# Patient Record
Sex: Female | Born: 1955
Health system: Southern US, Community
[De-identification: ages and names within clinical notes are randomized; demographics above are authoritative.]

## PROBLEM LIST (undated history)

## (undated) DIAGNOSIS — E119 Type 2 diabetes mellitus without complications: Secondary | ICD-10-CM

## (undated) DIAGNOSIS — E871 Hypo-osmolality and hyponatremia: Secondary | ICD-10-CM

## (undated) DIAGNOSIS — N809 Endometriosis, unspecified: Secondary | ICD-10-CM

## (undated) DIAGNOSIS — N151 Renal and perinephric abscess: Secondary | ICD-10-CM

## (undated) DIAGNOSIS — F419 Anxiety disorder, unspecified: Secondary | ICD-10-CM

## (undated) DIAGNOSIS — N200 Calculus of kidney: Secondary | ICD-10-CM

## (undated) DIAGNOSIS — N118 Other chronic tubulo-interstitial nephritis: Secondary | ICD-10-CM

## (undated) DIAGNOSIS — S0291XA Unspecified fracture of skull, initial encounter for closed fracture: Secondary | ICD-10-CM

## (undated) DIAGNOSIS — D649 Anemia, unspecified: Secondary | ICD-10-CM

## (undated) DIAGNOSIS — N189 Chronic kidney disease, unspecified: Secondary | ICD-10-CM

## (undated) HISTORY — PX: TUBAL LIGATION: SHX77

## (undated) HISTORY — PX: OTHER SURGICAL HISTORY: SHX169

---

## 2014-08-21 ENCOUNTER — Emergency Department (HOSPITAL_COMMUNITY): Payer: BLUE CROSS/BLUE SHIELD

## 2014-08-21 ENCOUNTER — Encounter (HOSPITAL_COMMUNITY): Payer: Self-pay | Admitting: Emergency Medicine

## 2014-08-21 ENCOUNTER — Inpatient Hospital Stay (HOSPITAL_COMMUNITY)
Admission: EM | Admit: 2014-08-21 | Discharge: 2014-08-23 | DRG: 690 | Disposition: A | Discharge status: Home Health | Payer: BLUE CROSS/BLUE SHIELD | Attending: Internal Medicine | Admitting: Internal Medicine

## 2014-08-21 DIAGNOSIS — E1165 Type 2 diabetes mellitus with hyperglycemia: Secondary | ICD-10-CM | POA: Diagnosis present

## 2014-08-21 DIAGNOSIS — R631 Polydipsia: Secondary | ICD-10-CM | POA: Diagnosis present

## 2014-08-21 DIAGNOSIS — E755 Other lipid storage disorders: Secondary | ICD-10-CM | POA: Diagnosis present

## 2014-08-21 DIAGNOSIS — N151 Renal and perinephric abscess: Secondary | ICD-10-CM | POA: Diagnosis present

## 2014-08-21 DIAGNOSIS — E1129 Type 2 diabetes mellitus with other diabetic kidney complication: Secondary | ICD-10-CM | POA: Diagnosis not present

## 2014-08-21 DIAGNOSIS — N12 Tubulo-interstitial nephritis, not specified as acute or chronic: Principal | ICD-10-CM | POA: Diagnosis present

## 2014-08-21 DIAGNOSIS — R188 Other ascites: Secondary | ICD-10-CM

## 2014-08-21 DIAGNOSIS — N23 Unspecified renal colic: Secondary | ICD-10-CM | POA: Diagnosis not present

## 2014-08-21 DIAGNOSIS — R739 Hyperglycemia, unspecified: Secondary | ICD-10-CM

## 2014-08-21 DIAGNOSIS — E119 Type 2 diabetes mellitus without complications: Secondary | ICD-10-CM

## 2014-08-21 DIAGNOSIS — E871 Hypo-osmolality and hyponatremia: Secondary | ICD-10-CM | POA: Diagnosis present

## 2014-08-21 DIAGNOSIS — N118 Other chronic tubulo-interstitial nephritis: Secondary | ICD-10-CM

## 2014-08-21 DIAGNOSIS — Z6823 Body mass index (BMI) 23.0-23.9, adult: Secondary | ICD-10-CM | POA: Diagnosis not present

## 2014-08-21 DIAGNOSIS — N200 Calculus of kidney: Secondary | ICD-10-CM | POA: Diagnosis present

## 2014-08-21 LAB — COMPREHENSIVE METABOLIC PANEL
ALT: 10 U/L — AB (ref 14–54)
AST: 15 U/L (ref 15–41)
Albumin: 2.8 g/dL — ABNORMAL LOW (ref 3.5–5.0)
Alkaline Phosphatase: 104 U/L (ref 38–126)
Anion gap: 11 (ref 5–15)
BUN: 11 mg/dL (ref 6–20)
CO2: 25 mmol/L (ref 22–32)
Calcium: 8.6 mg/dL — ABNORMAL LOW (ref 8.9–10.3)
Chloride: 92 mmol/L — ABNORMAL LOW (ref 101–111)
Creatinine, Ser: 0.72 mg/dL (ref 0.44–1.00)
GFR calc Af Amer: >60 mL/min (ref >60)
Glucose, Bld: 485 mg/dL — ABNORMAL HIGH (ref 65–99)
Potassium: 4.1 mmol/L (ref 3.5–5.1)
Sodium: 128 mmol/L — ABNORMAL LOW (ref 135–145)
Total Bilirubin: 0.4 mg/dL (ref 0.3–1.2)
Total Protein: 7.4 g/dL (ref 6.5–8.1)

## 2014-08-21 LAB — CBC WITH DIFFERENTIAL/PLATELET
Basophils Absolute: 0.1 10*3/uL (ref 0.0–0.1)
Basophils Relative: 1 % (ref 0–1)
Eosinophils Absolute: 0.1 10*3/uL (ref 0.0–0.7)
Eosinophils Relative: 1 % (ref 0–5)
HEMATOCRIT: 32.7 % — AB (ref 36.0–46.0)
Hemoglobin: 10.2 g/dL — ABNORMAL LOW (ref 12.0–15.0)
Lymphocytes Relative: 15 % (ref 12–46)
Lymphs Abs: 1.2 10*3/uL (ref 0.7–4.0)
MCH: 24.4 pg — ABNORMAL LOW (ref 26.0–34.0)
MCHC: 31.2 g/dL (ref 30.0–36.0)
MCV: 78.2 fL (ref 78.0–100.0)
Monocytes Absolute: 0.6 10*3/uL (ref 0.1–1.0)
Monocytes Relative: 8 % (ref 3–12)
NEUTROS ABS: 6 10*3/uL (ref 1.7–7.7)
Neutrophils Relative %: 75 % (ref 43–77)
Platelets: 486 10*3/uL — ABNORMAL HIGH (ref 150–400)
RBC: 4.18 MIL/uL (ref 3.87–5.11)
RDW: 14.8 % (ref 11.5–15.5)
WBC: 8 10*3/uL (ref 4.0–10.5)

## 2014-08-21 LAB — URINE MICROSCOPIC-ADD ON

## 2014-08-21 LAB — URINALYSIS, ROUTINE W REFLEX MICROSCOPIC
Bilirubin Urine: NEGATIVE
Ketones, ur: NEGATIVE mg/dL
Nitrite: NEGATIVE
PH: 6.5 (ref 5.0–8.0)
Protein, ur: NEGATIVE mg/dL
SPECIFIC GRAVITY, URINE: 1.022 (ref 1.005–1.030)
Urobilinogen, UA: 0.2 mg/dL (ref 0.0–1.0)

## 2014-08-21 LAB — I-STAT CG4 LACTIC ACID, ED: Lactic Acid, Venous: 2.72 mmol/L (ref 0.5–2.0)

## 2014-08-21 LAB — TSH: TSH: 1.294 u[IU]/mL (ref 0.350–4.500)

## 2014-08-21 LAB — CBG MONITORING, ED
GLUCOSE-CAPILLARY: 282 mg/dL — AB (ref 65–99)
Glucose-Capillary: 325 mg/dL — ABNORMAL HIGH (ref 65–99)

## 2014-08-21 MED ORDER — ONDANSETRON HCL 4 MG PO TABS: 4.0000 mg | ORAL_TABLET | Freq: Four times a day (QID) | ORAL | Status: DC | PRN

## 2014-08-21 MED ORDER — FOLIC ACID 1 MG PO TABS
1.0000 mg | ORAL_TABLET | Freq: Every day | ORAL | Status: DC
  Administered 2014-08-21 – 2014-08-23 (×2): 1 mg via ORAL
  Filled 2014-08-21 (×2): qty 1

## 2014-08-21 MED ORDER — SODIUM CHLORIDE 0.9 % IV BOLUS (SEPSIS)
1000.0000 mL | Freq: Once | INTRAVENOUS | Status: AC
  Administered 2014-08-21: 1000 mL via INTRAVENOUS

## 2014-08-21 MED ORDER — DEXTROSE 5 % IV SOLN
1.0000 g | INTRAVENOUS | Status: DC
  Administered 2014-08-22: 1 g via INTRAVENOUS
  Filled 2014-08-21 (×2): qty 10

## 2014-08-21 MED ORDER — ACETAMINOPHEN 650 MG RE SUPP: 650.0000 mg | Freq: Four times a day (QID) | RECTAL | Status: DC | PRN

## 2014-08-21 MED ORDER — VANCOMYCIN HCL IN DEXTROSE 1-5 GM/200ML-% IV SOLN
1000.0000 mg | Freq: Once | INTRAVENOUS | Status: AC
  Administered 2014-08-21: 1000 mg via INTRAVENOUS
  Filled 2014-08-21: qty 200

## 2014-08-21 MED ORDER — VITAMIN B-1 100 MG PO TABS
100.0000 mg | ORAL_TABLET | Freq: Every day | ORAL | Status: DC
  Administered 2014-08-21 – 2014-08-23 (×2): 100 mg via ORAL
  Filled 2014-08-21 (×2): qty 1

## 2014-08-21 MED ORDER — ACETAMINOPHEN 325 MG PO TABS: 650.0000 mg | ORAL_TABLET | Freq: Four times a day (QID) | ORAL | Status: DC | PRN

## 2014-08-21 MED ORDER — ONDANSETRON HCL 4 MG/2ML IJ SOLN: 4.0000 mg | Freq: Four times a day (QID) | INTRAMUSCULAR | Status: DC | PRN

## 2014-08-21 MED ORDER — SODIUM CHLORIDE 0.9 % IV SOLN
INTRAVENOUS | Status: DC
  Administered 2014-08-21 – 2014-08-23 (×2): via INTRAVENOUS

## 2014-08-21 MED ORDER — ADULT MULTIVITAMIN W/MINERALS CH
1.0000 | ORAL_TABLET | Freq: Every day | ORAL | Status: DC
  Administered 2014-08-21 – 2014-08-23 (×2): 1 via ORAL
  Filled 2014-08-21 (×2): qty 1

## 2014-08-21 MED ORDER — CLINDAMYCIN PHOSPHATE 600 MG/50ML IV SOLN
600.0000 mg | Freq: Four times a day (QID) | INTRAVENOUS | Status: DC
Start: 2014-08-21 — End: 2014-08-22
  Administered 2014-08-21 – 2014-08-22 (×2): 600 mg via INTRAVENOUS
  Filled 2014-08-21 (×4): qty 50

## 2014-08-21 MED ORDER — CEFTRIAXONE SODIUM 1 G IJ SOLR
1.0000 g | Freq: Once | INTRAMUSCULAR | Status: AC
  Administered 2014-08-21: 1 g via INTRAVENOUS
  Filled 2014-08-21: qty 10

## 2014-08-21 MED ORDER — INSULIN ASPART 100 UNIT/ML ~~LOC~~ SOLN
0.0000 [IU] | SUBCUTANEOUS | Status: DC
  Administered 2014-08-21: 15 [IU] via SUBCUTANEOUS
  Administered 2014-08-22 (×2): 5 [IU] via SUBCUTANEOUS
  Administered 2014-08-22: 8 [IU] via SUBCUTANEOUS
  Administered 2014-08-23: 3 [IU] via SUBCUTANEOUS
  Administered 2014-08-23: 5 [IU] via SUBCUTANEOUS
  Administered 2014-08-23: 2 [IU] via SUBCUTANEOUS
  Filled 2014-08-21 (×2): qty 1

## 2014-08-21 NOTE — ED Provider Notes (Signed)
CSN: [id number]     Arrival date & time 08/21/14  1655 History   First MD Initiated Contact with Patient 08/21/14 1717     Chief Complaint  Patient presents with  . Renal Infection      (Consider location/radiation/quality/duration/timing/severity/associated sxs/prior Treatment) The history is provided by the patient.   patient was sent in for reported abscess from her kidney above the stone coming out to the skin. Had a CT scan done as an outpatient. Also told that she was diabetic with a sugar 400. States she's been urinating freely. She's been feeling bad somewhat over the last 6 months. States she lost around 65 pounds but had been trying initially but states began come off without her trying. She has a swelling in her left flank that she noticed a month ago. No fevers. No chills. Has otherwise healthy. No nausea or vomiting.  History reviewed. No pertinent past medical history. History reviewed. No pertinent past surgical history. History reviewed. No pertinent family history. History  Substance Use Topics  . Smoking status: Never Smoker   . Smokeless tobacco: Not on file  . Alcohol Use: Yes     Comment: occasionally   OB History    No data available     Review of Systems  Constitutional: Positive for appetite change and unexpected weight change. Negative for activity change.  Eyes: Negative for pain.  Respiratory: Negative for chest tightness and shortness of breath.   Cardiovascular: Negative for chest pain and leg swelling.  Gastrointestinal: Negative for nausea, vomiting, abdominal pain and diarrhea.  Genitourinary: Positive for frequency and flank pain.  Musculoskeletal: Negative for back pain and neck stiffness.  Skin: Positive for color change and wound. Negative for rash.  Neurological: Negative for weakness, numbness and headaches.  Psychiatric/Behavioral: Negative for behavioral problems.      Allergies  Review of patient's allergies indicates no known  allergies.  Home Medications   Prior to Admission medications   Medication Sig Start Date End Date Taking? Authorizing Provider  Multiple Vitamin (MULTIVITAMIN) tablet Take 1 tablet by mouth daily.   Yes Historical Provider, MD   BP 98/39 mmHg  Pulse 88  Temp(Src) 99.9 F (37.7 C) (Oral)  Resp 16  SpO2 97% Physical Exam  Constitutional: She appears well-developed and well-nourished.  Cardiovascular: Normal rate.   Pulmonary/Chest: Effort normal.  Abdominal: She exhibits distension.  Some mild left-sided tenderness with some distention.  Musculoskeletal: Normal range of motion.  Neurological: She is alert.  Skin: Skin is warm.  There is a swelling was erythema and some fluctuance at left posterior flank area. Approximately 10 cm across.      ED Course  Procedures (including critical care time) Labs Review Labs Reviewed  CBC WITH DIFFERENTIAL/PLATELET - Abnormal; Notable for the following:    Hemoglobin 10.2 (*)    HCT 32.7 (*)    MCH 24.4 (*)    Platelets 486 (*)    All other components within normal limits  COMPREHENSIVE METABOLIC PANEL - Abnormal; Notable for the following:    Sodium 128 (*)    Chloride 92 (*)    Glucose, Bld 485 (*)    Calcium 8.6 (*)    Albumin 2.8 (*)    ALT 10 (*)    All other components within normal limits  URINALYSIS, ROUTINE W REFLEX MICROSCOPIC (NOT AT [facility]) - Abnormal; Notable for the following:    APPearance CLOUDY (*)    Glucose, UA >1000 (*)    Hgb urine dipstick  LARGE (*)    Leukocytes, UA LARGE (*)    All other components within normal limits  URINE MICROSCOPIC-ADD ON - Abnormal; Notable for the following:    Bacteria, UA FEW (*)    All other components within normal limits  I-STAT CG4 LACTIC ACID, ED - Abnormal; Notable for the following:    Lactic Acid, Venous 2.72 (*)    All other components within normal limits  CBG MONITORING, ED - Abnormal; Notable for the following:    Glucose-Capillary 325 (*)    All other components  within normal limits  CBG MONITORING, ED - Abnormal; Notable for the following:    Glucose-Capillary 282 (*)    All other components within normal limits  URINE CULTURE  CULTURE, BLOOD (ROUTINE X 2)  CULTURE, BLOOD (ROUTINE X 2)  TSH  HEMOGLOBIN A1C  COMPREHENSIVE METABOLIC PANEL  CBC    Imaging Review Dg Chest 2 View  08/21/2014   CLINICAL DATA:  59 year old female with left flank bulge/abscess  EXAM: CHEST  2 VIEW  COMPARISON:  None.  FINDINGS: The lungs are clear and negative for focal airspace consolidation, pulmonary edema or suspicious pulmonary nodule. Mild central bronchitic change. Slight elevation of the left hemidiaphragm. No pleural effusion or pneumothorax. Cardiac and mediastinal contours are within normal limits. No acute fracture or lytic or blastic osseous lesions. The visualized upper abdominal bowel gas pattern is unremarkable.  IMPRESSION: Nonspecific elevation of the left hemidiaphragm with trace left basilar subsegmental atelectasis.  Otherwise, no acute cardiopulmonary process.   Electronically Signed   By: Malachy MoanHeath  McCullough M.D.   On: 08/21/2014 19:52     EKG Interpretation None      MDM   Final diagnoses:  Xanthogranulomatous pyelonephritis  Hyperglycemia    Patient presents with hyperglycemia. Also renal infection that goes through to the skin. Has been seen by nephrology likely has been going on for 6 months. Likely will need IR drain versus surgery.    Benjiman CoreNathan Cerinity Zynda, MD 08/22/14 [phone]

## 2014-08-21 NOTE — ED Notes (Signed)
Pt c/o large bulge to left flank, bulge is reddened at apex. Pt states her MD told her that she had a large renal calculi which obstructed her kidney, caused infection, and that bulge is where infection protrudes through tissue. Pt denies n/v. Pt states she had hematuria x 1 day last week, some dysuria

## 2014-08-21 NOTE — H&P (Signed)
[facility] History and Physical  Catherine Ali ZOX:[id number] DOB: 8/16/Catherine Downs1957 DOA: 08/21/2014  Referring physician: Benjiman CoreNathan Pickering, MD PCP: No primary care provider on file.   Chief Complaint: Kidney Pain  HPI: Catherine Ali is a 59 y.o. female with no significant medical history presents with a kidney infection. She states that she noted a swelling in her flank area about a month ago. Patient states that she went to the urgent care last week. Initially she was told it is a hernia. They did order a CT scan which was done in [facility]. The scan apparently is suggestive of a renal abscess. Orlene Plumna ddition she has a staghorn calculus. Patient had some lab work done and is also noted to be a diabetic. Here in the ED she has no pain noted she does not look toxic at this time. The ED did call urology and he is going to try to get IR to possibly drain the abscess or she may need to have a nephrectomy.   Review of Systems:  Constitutional:  +weight loss on purpose 65 pounds, no night sweats, no Fevers, +chills.  HEENT:  No headaches, ear ache, nasal congestion, post nasal drip,  Cardio-vascular:  No chest pain, Orthopnea, PND, swelling in lower extremities +dizziness  GI:  No heartburn, indigestion, abdominal pain, nausea, vomiting, diarrhea  Resp:  No shortness of breath with exertion or at rest. no coughing up of blood.No change in color of mucus Skin:  +rash on the left flank along with swelling in the flank GU:  no dysuria, change in color of urine, no urgency or frequency  Musculoskeletal:  No joint pain or swelling. No decreased range of motion  Psych:  No change in mood or affect. No depression or anxiety   History reviewed. No pertinent past medical history. History reviewed. No pertinent past surgical history. Social History:  reports that she has never smoked. She does not have any smokeless tobacco history on file. She reports that she drinks alcohol. She reports that  she does not use illicit drugs.  No Known Allergies  History reviewed. No pertinent family history.   Prior to Admission medications   Medication Sig Start Date End Date Taking? Authorizing Provider  Multiple Vitamin (MULTIVITAMIN) tablet Take 1 tablet by mouth daily.   Yes Historical Provider, MD   Physical Exam: Filed Vitals:   08/21/14 1710 08/21/14 1945  BP: 145/77 124/67  Pulse: 93 84  Temp: 98.3 F (36.8 C)   TempSrc: Oral   Resp: 18 14  SpO2: 100% 96%    Wt Readings from Last 3 Encounters:  No data found for Wt    General:  Appears calm and comfortable Eyes: PERRL, normal lids, irises & conjunctiva ENT: grossly normal hearing, lips & tongue Neck: no LAD, masses or thyromegaly Cardiovascular: RRR, no m/r/g. No LE edema. Respiratory: CTA bilaterally Abdomen: soft, ntnd Skin: +rash in left flank with swelling Musculoskeletal: grossly normal tone BUE/BLE Psychiatric: grossly normal mood and affect Neurologic: grossly non-focal.          Labs on Admission:  Basic Metabolic Panel:  Recent Labs Lab 08/21/14 1756  NA 128*  K 4.1  CL 92*  CO2 25  GLUCOSE 485*  BUN 11  CREATININE 0.72  CALCIUM 8.6*   Liver Function Tests:  Recent Labs Lab 08/21/14 1756  AST 15  ALT 10*  ALKPHOS 104  BILITOT 0.4  PROT 7.4  ALBUMIN 2.8*   No results for input(s): LIPASE, AMYLASE in the last  168 hours. No results for input(s): AMMONIA in the last 168 hours. CBC:  Recent Labs Lab 08/21/14 1756  WBC 8.0  NEUTROABS 6.0  HGB 10.2*  HCT 32.7*  MCV 78.2  PLT 486*   Cardiac Enzymes: No results for input(s): CKTOTAL, CKMB, CKMBINDEX, TROPONINI in the last 168 hours.  BNP (last 3 results) No results for input(s): BNP in the last 8760 hours.  ProBNP (last 3 results) No results for input(s): PROBNP in the last 8760 hours.  CBG: No results for input(s): GLUCAP in the last 168 hours.  Radiological Exams on Admission: Dg Chest 2 View  08/21/2014   CLINICAL  DATA:  59 year old female with left flank bulge/abscess  EXAM: CHEST  2 VIEW  COMPARISON:  None.  FINDINGS: The lungs are clear and negative for focal airspace consolidation, pulmonary edema or suspicious pulmonary nodule. Mild central bronchitic change. Slight elevation of the left hemidiaphragm. No pleural effusion or pneumothorax. Cardiac and mediastinal contours are within normal limits. No acute fracture or lytic or blastic osseous lesions. The visualized upper abdominal bowel gas pattern is unremarkable.  IMPRESSION: Nonspecific elevation of the left hemidiaphragm with trace left basilar subsegmental atelectasis.  Otherwise, no acute cardiopulmonary process.   Electronically Signed   By: Malachy Moan M.D.   On: 08/21/2014 19:52     Assessment/Plan Principal Problem:   Renal abscess, left Active Problems:   Type 2 diabetes mellitus   Hyponatremia   1. Left Renal Abscess -CT scan reviewed very impressive findings with air in the kidney and also tracking in the SQ tissue -Discussed with Dr Marlou Porch and he suggested holding off on antibiotics initially but would be Ok to give coverage for anaerobic as well as routine urological coverage  -Urology has seen the patient in the ED will await further recs. Possible IR drainage or surgery  2. Type 2 Diabetes Mellitus -this is new onset. Right now no AG noted will hydrate and place on SSI no step down beds available -carb modified diet  3. Hyponatremia -due to elevated blood glucose -will hydrate and monitor labs   Code Status: Full Code (must indicate code status--if unknown or must be presumed, indicate so) DVT Prophylaxis:SCD Family Communication: none (indicate person spoken with, if applicable, with phone number if by telephone) Disposition Plan: home (indicate anticipated LOS)  Time spent:  [facility] Pager [phone]

## 2014-08-21 NOTE — ED Notes (Signed)
Blood sugar: 282mg /dL

## 2014-08-21 NOTE — Consult Note (Signed)
I have been asked to see the patient by Dr. Carmell Austria, for evaluation and management of large left XGP extending into the subcutaneous space.  History of present illness: This is a 59 year old female who was referred to the emergency department after having a CT scan which demonstrated a xanthoma granulomatous pyelonephritis of the left kidney with a perinephric abscess extending to the subcutaneous tissue. The patient states that she has not seen a doctor for over 10 years and has been healthy up until recently. The patient states that over the last 6 months she has lost 65 pounds, this was initially intentional but then she continued to lose weight unintentionally. She noted about a month ago that she had a bulge in her left flank.  She also has more recently felt more fatigued. This was all concerning to her, and so she sought care at an urgent care facility.  At the urgent care facility, the patient left flank bolt was noted. Her blood sugar was also noted to be 465. A CT scan was ordered which was completed earlier this afternoon.  Remarkably, the patient has minimal pain. She has not had any flank pain so to speak. She has not had any gross hematuria or dysuria. She has not had any fevers or chills. She does endorse polydipsia and frequent urination. She also states that she occasionally has orthostatic hypotension. Prior to her presentation, the patient was taking no medications. The patient is passed several kidney stones on her own, the last one being 10 years prior. She has never seen a urologist. She has no significant history of recurrent urinary tract infections.  The patient's past medical history is largely unremarkable. The patient has no surgical history. The patient's family history is noncontributory. The patient is divorced and has 2 sons, one lives in [place] and the second son lives in [place]. She is a nonsmoker. She works full-time on Industrial/product designer. She  worked up until the end of last week.  Review of systems: A 12 point comprehensive review of systems was obtained and is negative unless otherwise stated in the history of present illness.  Patient Active Problem List   Diagnosis Date Noted  . Type 2 diabetes mellitus 08/21/2014  . Renal abscess, left 08/21/2014  . Hyponatremia 08/21/2014    No current facility-administered medications on file prior to encounter.   No current outpatient prescriptions on file prior to encounter.    History reviewed. No pertinent past medical history.  History reviewed. No pertinent past surgical history.  History  Substance Use Topics  . Smoking status: Never Smoker   . Smokeless tobacco: Not on file  . Alcohol Use: Yes     Comment: occasionally    History reviewed. No pertinent family history.  PE: Filed Vitals:   08/21/14 1710 08/21/14 1945  BP: 145/77 124/67  Pulse: 93 84  Temp: 98.3 F (36.8 C)   TempSrc: Oral   Resp: 18 14  SpO2: 100% 96%   Patient appears to be in no acute distress  patient is alert and oriented x3 Atraumatic normocephalic head No cervical or supraclavicular lymphadenopathy appreciated No increased work of breathing, no audible wheezes/rhonchi Regular sinus rhythm/rate Abdomen is soft, nontender, nondistended The patient's left flank is notable for a baseball-sized fluctuant protuberance with overlying ecchymosis. The mass is warm. There is no cellulitic component to it. It is nontender to palpation. Lower extremities are symmetric without appreciable edema Grossly neurologically intact    Recent Labs  08/21/14 1756  WBC 8.0  HGB 10.2*  HCT 32.7*    Recent Labs  08/21/14 1756  NA 128*  K 4.1  CL 92*  CO2 25  GLUCOSE 485*  BUN 11  CREATININE 0.72  CALCIUM 8.6*   No results for input(s): LABPT, INR in the last 72 hours. No results for input(s): LABURIN in the last 72 hours. No results found for this or any previous visit.  Imaging: The  CT scan will be uploaded into the PACS system tomorrow morning. The patient's CT scan is a contrast enhanced CT scan demonstrating a large left and throat granulomatous pyelonephritis with surrounding perinephric/perisplenic fluid with air bubbles which extends posteriorly around the 11th rib to the subcutaneous tissue. There is a large tachycardic calculus in the renal pelvis the left. The right kidney is normal appearing without any kidney stones.   Imp: The patient has a large left-sided xantho-granulomatous pyelonephritis that has fistulized into the surrounding space as well as into the subcutaneous tissue posteriorly. Given the findings on the CT scan, the patient looks remarkably healthy without any evidence otherwise of infection. She is afebrile, minimal pain with a normal white blood cell count.  Recommendations: I discussed this case with interventional radiology extensively. Our plan tentatively is to have interventional radiology place a drain in the perinephric space as well as the subcutaneous tissue. I think draining this with a percutaneous drain is the least morbid option for the patient and will allow the abscess to drain and cool off facilitating a cleaner operation in the future. Ultimately, the patient will need a radical nephrectomy. My hope is that the abscess is drained successfully with IR and that the patient can be discharged home with these drains. We would then schedule her for an elective nephrectomy in the near future.  The patient will need to remain nothing by mouth past midnight. She will be seen and evaluated tomorrow morning by interventional radiology who will then schedule her for drain placement at some point tomorrow. Given that she is afebrile with a normal white blood cell count, I don't see any role for antibiotics this point.  Thank you for involving me in this patient's care, we will continue to follow this very pleasant patient closely. Berniece SalinesHERRICK, Puja Caffey  W

## 2014-08-22 ENCOUNTER — Inpatient Hospital Stay (HOSPITAL_COMMUNITY): Payer: BLUE CROSS/BLUE SHIELD

## 2014-08-22 DIAGNOSIS — N118 Other chronic tubulo-interstitial nephritis: Secondary | ICD-10-CM | POA: Insufficient documentation

## 2014-08-22 DIAGNOSIS — N151 Renal and perinephric abscess: Secondary | ICD-10-CM

## 2014-08-22 LAB — CBC
HCT: 30.5 % — ABNORMAL LOW (ref 36.0–46.0)
HEMOGLOBIN: 9.5 g/dL — AB (ref 12.0–15.0)
MCH: 24.3 pg — AB (ref 26.0–34.0)
MCHC: 31.1 g/dL (ref 30.0–36.0)
MCV: 78 fL (ref 78.0–100.0)
PLATELETS: 480 10*3/uL — AB (ref 150–400)
RBC: 3.91 MIL/uL (ref 3.87–5.11)
RDW: 14.9 % (ref 11.5–15.5)
WBC: 7.7 10*3/uL (ref 4.0–10.5)

## 2014-08-22 LAB — GLUCOSE, CAPILLARY
GLUCOSE-CAPILLARY: 132 mg/dL — AB (ref 65–99)
GLUCOSE-CAPILLARY: 219 mg/dL — AB (ref 65–99)
Glucose-Capillary: 259 mg/dL — ABNORMAL HIGH (ref 65–99)

## 2014-08-22 LAB — COMPREHENSIVE METABOLIC PANEL
ALT: 10 U/L — ABNORMAL LOW (ref 14–54)
AST: 12 U/L — ABNORMAL LOW (ref 15–41)
Albumin: 2.5 g/dL — ABNORMAL LOW (ref 3.5–5.0)
Alkaline Phosphatase: 91 U/L (ref 38–126)
Anion gap: 8 (ref 5–15)
BILIRUBIN TOTAL: 0.4 mg/dL (ref 0.3–1.2)
BUN: 9 mg/dL (ref 6–20)
CO2: 26 mmol/L (ref 22–32)
Calcium: 8.4 mg/dL — ABNORMAL LOW (ref 8.9–10.3)
Chloride: 103 mmol/L (ref 101–111)
Creatinine, Ser: 0.6 mg/dL (ref 0.44–1.00)
GFR calc Af Amer: >60 mL/min (ref >60)
GFR calc non Af Amer: >60 mL/min (ref >60)
Glucose, Bld: 162 mg/dL — ABNORMAL HIGH (ref 65–99)
POTASSIUM: 3.7 mmol/L (ref 3.5–5.1)
SODIUM: 137 mmol/L (ref 135–145)
Total Protein: 7 g/dL (ref 6.5–8.1)

## 2014-08-22 LAB — CBG MONITORING, ED
GLUCOSE-CAPILLARY: 111 mg/dL — AB (ref 65–99)
GLUCOSE-CAPILLARY: 201 mg/dL — AB (ref 65–99)
Glucose-Capillary: 104 mg/dL — ABNORMAL HIGH (ref 65–99)
Glucose-Capillary: 95 mg/dL (ref 65–99)

## 2014-08-22 MED ORDER — FENTANYL CITRATE (PF) 100 MCG/2ML IJ SOLN
INTRAMUSCULAR | Status: AC | PRN
  Administered 2014-08-22 (×2): 25 ug via INTRAVENOUS

## 2014-08-22 MED ORDER — FENTANYL CITRATE (PF) 100 MCG/2ML IJ SOLN
INTRAMUSCULAR | Status: AC
  Filled 2014-08-22: qty 4

## 2014-08-22 MED ORDER — VANCOMYCIN HCL IN DEXTROSE 750-5 MG/150ML-% IV SOLN
750.0000 mg | Freq: Two times a day (BID) | INTRAVENOUS | Status: DC
  Administered 2014-08-22 – 2014-08-23 (×3): 750 mg via INTRAVENOUS
  Filled 2014-08-22 (×4): qty 150

## 2014-08-22 MED ORDER — MIDAZOLAM HCL 2 MG/2ML IJ SOLN
INTRAMUSCULAR | Status: AC
  Filled 2014-08-22: qty 6

## 2014-08-22 MED ORDER — MIDAZOLAM HCL 2 MG/2ML IJ SOLN
INTRAMUSCULAR | Status: AC | PRN
  Administered 2014-08-22: 0.5 mg via INTRAVENOUS
  Administered 2014-08-22: 1 mg via INTRAVENOUS
  Administered 2014-08-22: 0.5 mg via INTRAVENOUS
  Administered 2014-08-22: 1 mg via INTRAVENOUS
  Administered 2014-08-22 (×2): 0.5 mg via INTRAVENOUS

## 2014-08-22 MED ORDER — HEPARIN SODIUM (PORCINE) 5000 UNIT/ML IJ SOLN
5000.0000 [IU] | Freq: Three times a day (TID) | INTRAMUSCULAR | Status: DC
  Administered 2014-08-22 – 2014-08-23 (×3): 5000 [IU] via SUBCUTANEOUS
  Filled 2014-08-22 (×6): qty 1

## 2014-08-22 NOTE — Progress Notes (Signed)
Patient Demographics  Catherine Ali, is a 59 y.o. female, DOB - [birth year], WUX:[id number]  Admit date - 08/21/2014   Admitting Physician Yevonne PaxSaadat A Khan, MD  Outpatient Primary MD for the patient is No primary care provider on file.  LOS - 1   Chief Complaint  Patient presents with  . 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 So she presents to ED for further evaluation, seen by urology in ED, with recommendation for IR drainage,  patient had drain placed with no significant output . Subjective:   Catherine Ali today has, No headache, No chest pain, No abdominal pain - No Nausea, No new weakness tingling or numbness, No Cough - SOB.   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 - Will follow on cultures, AFB and smears . - Continue with IV Rocephin pending further culture results .  Diabetes mellitus - Posterior BUN diagnosis, follow on hemoglobin A1c,  continue to monitor on insulin sliding scale, will consider starting metformin. Discharge.   hyponatremia - Resolved,    Code Status: Full code   Family Communication: Family at bedside   Disposition Plan: Home when stable    Procedures  Left perinephric fluid collection CT-guided drainage of percutaneous catheter    Consults     Urology IR    Medications  Scheduled Meds: . cefTRIAXone (ROCEPHIN)  IV  1 g Intravenous Q24H  . clindamycin (CLEOCIN) IV  600 mg Intravenous 4 times per day  . folic acid  1 mg Oral Daily  . insulin aspart  0-15 Units Subcutaneous 6 times per day  . multivitamin with minerals  1 tablet Oral Daily  . thiamine  100 mg Oral Daily  . vancomycin  750 mg Intravenous Q12H   Continuous Infusions: . sodium chloride 75 mL/hr at 08/21/14 2154   PRN Meds:.acetaminophen **OR** acetaminophen, ondansetron **OR** ondansetron (ZOFRAN) IV  DVT Prophylaxis  heparin  Lab Results  Component Value Date   PLT 480* 08/22/2014    Antibiotics    Anti-infectives    Start     Dose/Rate Route Frequency Ordered Stop   08/22/14 2000  cefTRIAXone (ROCEPHIN) 1 g in dextrose 5 % 50 mL IVPB     1 g 100 mL/hr over 30 Minutes Intravenous Every 24 hours 08/21/14 2219     08/22/14 1000  vancomycin (VANCOCIN) IVPB 750 mg/150 ml premix     750 mg 150 mL/hr over 60 Minutes Intravenous Every 12 hours 08/22/14 0634     08/21/14 2130  vancomycin (VANCOCIN) IVPB 1000 mg/200 mL premix     1,000 mg 200 mL/hr over 60 Minutes Intravenous  Once 08/21/14 2108 08/21/14 2258   08/21/14 2045  clindamycin (  CLEOCIN) IVPB 600 mg     600 mg 100 mL/hr over 30 Minutes Intravenous 4 times per day 08/21/14 2032     08/21/14 1945  cefTRIAXone (ROCEPHIN) 1 g in dextrose 5 % 50 mL IVPB     1 g 100 mL/hr over 30 Minutes Intravenous  Once 08/21/14 1935 08/21/14 2018          Objective:   Filed Vitals:   08/22/14 1200 08/22/14 1246 08/22/14 1345 08/22/14 1445  BP: 95/48 115/64 108/59 119/58  Pulse: 73 74 77 84  Temp:  98 F (36.7 C) 98 F (36.7 C) 98.5 F (36.9 C)  TempSrc:  Oral Oral Oral  Resp:   18 18  Height:      Weight:      SpO2: 99% 100% 98% 98%    Wt Readings from Last 3 Encounters:  08/22/14 65.772 kg (145 lb)     Intake/Output Summary (Last 24 hours) at 08/22/14 1527 Last data filed at 08/22/14  1403  Gross per 24 hour  Intake    240 ml  Output      0 ml  Net    240 ml     Physical Exam  Awake Alert, Oriented X 3, No new F.N deficits, Normal affect [place].AT,PERRAL Supple Neck,No JVD, No cervical lymphadenopathy appriciated.  Symmetrical Chest wall movement, Good air movement bilaterally,  RRR,No Gallops,Rubs or new Murmurs, No Parasternal Heave +ve B.Sounds, Abd Soft, No tenderness,  No organomegaly appriciated, No rebound - guarding or rigidity.  2 drains originated from left flank with minimal . No Cyanosanguinous material sis, Clubbing or edema, No new Rash or bruise     Data Review   Micro Results No results found for this or any previous visit (from the past 240 hour(s)).  Radiology Reports Dg Chest 2 View  08/21/2014   CLINICAL DATA:  59 year old female with left flank bulge/abscess  EXAM: CHEST  2 VIEW  COMPARISON:  None.  FINDINGS: The lungs are clear and negative for focal airspace consolidation, pulmonary edema or suspicious pulmonary nodule. Mild central bronchitic change. Slight elevation of the left hemidiaphragm. No pleural effusion or pneumothorax. Cardiac and mediastinal contours are within normal limits. No acute fracture or lytic or blastic osseous lesions. The visualized upper abdominal bowel gas pattern is unremarkable.  IMPRESSION: Nonspecific elevation of the left hemidiaphragm with trace left basilar subsegmental atelectasis.  Otherwise, no acute cardiopulmonary process.   Electronically Signed   By: Malachy Moan M.D.   On: 08/21/2014 19:52     CBC  Recent Labs Lab 08/21/14 1756 08/22/14 0500  WBC 8.0 7.7  HGB 10.2* 9.5*  HCT 32.7* 30.5*  PLT 486* 480*  MCV 78.2 78.0  MCH 24.4* 24.3*  MCHC 31.2 31.1  RDW 14.8 14.9  LYMPHSABS 1.2  --   MONOABS 0.6  --   EOSABS 0.1  --   BASOSABS 0.1  --     Chemistries   Recent Labs Lab 08/21/14 1756 08/22/14 0500  NA 128* 137  K 4.1 3.7  CL 92* 103  CO2 25 26  GLUCOSE 485* 162*  BUN 11 9    CREATININE 0.72 0.60  CALCIUM 8.6* 8.4*  AST 15 12*  ALT 10* 10*  ALKPHOS 104 91  BILITOT 0.4 0.4   ------------------------------------------------------------------------------------------------------------------ estimated creatinine clearance is 71.8 mL/min (by C-G formula based on Cr of 0.6). ------------------------------------------------------------------------------------------------------------------ No results for input(s): HGBA1C in the last 72 hours. ------------------------------------------------------------------------------------------------------------------ No results for input(s): CHOL, HDL, LDLCALC,  TRIG, CHOLHDL, LDLDIRECT in the last 72 hours. ------------------------------------------------------------------------------------------------------------------  Recent Labs  08/21/14 2058  TSH 1.294   ------------------------------------------------------------------------------------------------------------------ No results for input(s): VITAMINB12, FOLATE, FERRITIN, TIBC, IRON, RETICCTPCT in the last 72 hours.  Coagulation profile No results for input(s): INR, PROTIME in the last 168 hours.  No results for input(s): DDIMER in the last 72 hours.  Cardiac Enzymes No results for input(s): CKMB, TROPONINI, MYOGLOBIN in the last 168 hours.  Invalid input(s): CK ------------------------------------------------------------------------------------------------------------------ Invalid input(s): POCBNP     Time Spent in minutes   25 minutes   Skilar Marcou M.D on 08/22/2014 at 3:27 PM  Between 7am to 7pm - Pager - [phone]  After 7pm go to [email or web address] - password [email or web address]  [facility]   Office  [phone]

## 2014-08-22 NOTE — Discharge Instructions (Signed)
Ascites Drainage Catheter Placement °Ascites is a gathering of fluid in the abdomen. Excess fluid can collect in the body from underlying conditions such as cancer or heart failure. Inserting an ascites drainage catheter is done to help drain fluid out of the body. A catheter is a thin, flexible tube. The catheter can stay attached to your body for several months. Draining the fluid will help you to avoid pain and other symptoms. In most cases, you will be able to go home the same day as the procedure (outpatient).  °LET YOUR CAREGIVER KNOW ABOUT: °· Allergies to food or medicine. °· Medicines taken, including vitamins, herbs, eyedrops, over-the-counter medicines, and creams. °· Use of steroids (by mouth or creams). °· Previous problems with anesthetics or numbing medicines. °· History of bleeding problems or blood clots. °· Previous abdominal or pelvic surgery. °· Other health problems, including diabetes and kidney problems. °· Possibility of pregnancy, if this applies. °RISKS AND COMPLICATIONS °General surgical complications may include: °· Reaction to anesthesia. °· Damage to surrounding nerves, tissues, or structures. °· A small hole (perforation) in your bowel. °· Infection. °· Bleeding. °The following complications are very rare: °· Electrolyte imbalance. °· Leakage of fluids. °· Blockage in the bowel. °· Kidney failure. °· Liver problems. °BEFORE THE PROCEDURE °It is important to follow your caregiver's instructions prior to your procedure to avoid complications. Steps before your procedure may include: °· A physical exam, blood tests, urine tests, X-rays, ultrasound exams, and other procedures. °· Chemotherapy or radiation therapy. °· A review of the procedure, the anesthesia being used, and what to expect after the procedure. °You may be asked to: °· Stop taking certain medicines for several days prior to your procedure, such as blood thinners (including aspirin). °· Take certain medicines, such as  antibiotics. °· Quit smoking. Smoking increases the chances of a healing problem after your procedure. °Arrange for someone to drive you home after surgery. You will also need someone to help you with drainage at home if you cannot do it yourself. Your caregiver can help you arrange for a home health service, if needed. °PROCEDURE °You will lie down on an X-ray table with your head elevated. Medicine will be given to numb the abdominal area (local anesthetic). You will be given medicine to help you relax (sedative). Your blood pressure and heart rate will be monitored during the procedure. °Your caregiver will clean the skin around the insertion site with antibacterial solution. A towel will be draped over the abdominal area to provide a clean work area. Ultrasound will be used to guide the placement of the catheter. An X-ray may be taken to ensure proper placement. Stitches will be placed on the skin around the catheter. The procedure takes about 15 minutes. °AFTER THE PROCEDURE °· Your caregiver may drain the fluid. °· You will need to stay and rest in the recovery area for a few hours until the sedative wears off. °· You can eat and drink as normal. °· You may need to return to your caregiver in 4 to 5 days to have your stitches removed. °Document Released: 02/19/2011 Document Revised: 05/25/2011 Document Reviewed: 02/19/2011 °ExitCare® Patient Information ©2015 [employer]. This information is not intended to replace advice given to you by your health care provider. Make sure you discuss any questions you have with your health care provider. ° °

## 2014-08-22 NOTE — ED Notes (Signed)
Introduced myself to family, pt is not back from procedure yet

## 2014-08-22 NOTE — Procedures (Signed)
Interventional Radiology Procedure Note  Procedure: 1.) Placement 22F drain into left perinephric/retroperitoneal fluid collection.  Minimal aspiration of perhaps 10 mL bloody fluid and clot.  2.) Placement of 22F drain into deep aspect of left flank soft tissue fluid and gas collection with aspiration of minimal (5 mL) bloody fluid.   Complications: None  Estimated Blood Loss: 0  Recommendations: - Follow cultures including TB - Given minimal aspiration output despite large bore drains, I suspect a high likelihood of drain failure.  Pt may need surgical debridement.   Signed,  Sterling BigHeath K. McCullough, MD

## 2014-08-22 NOTE — Progress Notes (Signed)
Urology Inpatient Progress Report left kidney pain  08/21/2014  Intv/Subj: No acute events overnight. Patient is without complaint.  History reviewed. No pertinent past medical history. Current Facility-Administered Medications  Medication Dose Route Frequency Provider Last Rate Last Dose  . 0.9 %  sodium chloride infusion   Intravenous Continuous Yevonne PaxSaadat A Khan, MD 75 mL/hr at 08/21/14 2154    . acetaminophen (TYLENOL) tablet 650 mg  650 mg Oral Q6H PRN Yevonne PaxSaadat A Khan, MD       Or  . acetaminophen (TYLENOL) suppository 650 mg  650 mg Rectal Q6H PRN Yevonne PaxSaadat A Khan, MD      . cefTRIAXone (ROCEPHIN) 1 g in dextrose 5 % 50 mL IVPB  1 g Intravenous Q24H Danford Badrew A Wofford, RPH      . clindamycin (CLEOCIN) IVPB 600 mg  600 mg Intravenous 4 times per day Yevonne PaxSaadat A Khan, MD   Stopped at 08/22/14 0535  . folic acid (FOLVITE) tablet 1 mg  1 mg Oral Daily Yevonne PaxSaadat A Khan, MD   1 mg at 08/21/14 2156  . insulin aspart (novoLOG) injection 0-15 Units  0-15 Units Subcutaneous 6 times per day Yevonne PaxSaadat A Khan, MD   5 Units at 08/22/14 0612  . multivitamin with minerals tablet 1 tablet  1 tablet Oral Daily Yevonne PaxSaadat A Khan, MD   1 tablet at 08/21/14 2156  . ondansetron (ZOFRAN) tablet 4 mg  4 mg Oral Q6H PRN Yevonne PaxSaadat A Khan, MD       Or  . ondansetron [facility](ZOFRAN) injection 4 mg  4 mg Intravenous Q6H PRN Yevonne PaxSaadat A Khan, MD      . thiamine (VITAMIN B-1) tablet 100 mg  100 mg Oral Daily Yevonne PaxSaadat A Khan, MD   100 mg at 08/21/14 2156  . vancomycin (VANCOCIN) IVPB 750 mg/150 ml premix  750 mg Intravenous Q12H Lorenza EvangelistElizabeth R Green, [facility]RPH       Current Outpatient Prescriptions  Medication Sig Dispense Refill  . Multiple Vitamin (MULTIVITAMIN) tablet Take 1 tablet by mouth daily.       Objective: Vital: Filed Vitals:   08/22/14 0000 08/22/14 0216 08/22/14 0605 08/22/14 0618  BP: 98/39 130/60 104/54   Pulse: 88 90 75   Temp:      TempSrc:      Resp: 16 18 18    Height:    5\' 6"  (1.676 m)  Weight:    65.772 kg (145 lb)  SpO2: 97%  93% 93%    I/Os:    Physical Exam:  General: Patient is in no apparent distress Lungs: Normal respiratory effort, chest expands symmetrically. GI: The abdomen is soft and nontender without mass. Ext: lower extremities symmetric  Lab Results:  Recent Labs  08/21/14 1756 08/22/14 0500  WBC 8.0 7.7  HGB 10.2* 9.5*  HCT 32.7* 30.5*    Recent Labs  08/21/14 1756 08/22/14 0500  NA 128* 137  K 4.1 3.7  CL 92* 103  CO2 25 26  GLUCOSE 485* 162*  BUN 11 9  CREATININE 0.72 0.60  CALCIUM 8.6* 8.4*   No results for input(s): LABPT, INR in the last 72 hours. No results for input(s): LABURIN in the last 72 hours. No results found for this or any previous visit.  Studies/Results: CT scan in [facility] system - being scanned into chart this AM  Assessment:  Large XGP on the left with abscess extending into the left flank/subcutaneous area.  Nontoxic appearing  Plan: Plan for IR drainage today.  Culture exudate. Home with  drains when stable. Consider Augmentin upon discharge. I will follow-up with patient next Monday. Dr. Retta Diones will be covering in my absense if any issues arise.  Please page him with urgent questions or concerns.  Berniece Salines W 08/22/2014, 7:09 AM

## 2014-08-22 NOTE — ED Notes (Addendum)
Pt notified that she will be taken upstairs to 5W

## 2014-08-22 NOTE — Consult Note (Signed)
Reason for consult:  Left xanthogranulomatous pyelonephritis with perinephric abscess, soft tissue fluid collection, need for percutaneous drainage  Referring Physician(s): Herrick/Elgergawy  History of Present Illness: Catherine Ali is a 59 y.o. 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 Current complaints are mild headache, and mild left flank soreness. She denies chest pain, dyspnea, nausea /vomiting, or unusual bleeding. She has had no prior surgeries. Patient was seen by urology and request is now been made for CT-guided drainage of the left perinephric and subcutaneous/ soft tissue abscesses/fluid collections.  History reviewed. No pertinent past medical history.  History reviewed. No pertinent past surgical history.  Allergies: Review of patient's allergies indicates no known allergies.  Medications: Prior to Admission medications   Medication Sig Start Date End Date Taking? Authorizing Provider  Multiple Vitamin (MULTIVITAMIN) tablet Take 1 tablet by mouth daily.   Yes Historical Provider, MD     History reviewed. No pertinent family history.  History   Social History  . Marital Status: Single    Spouse Name: N/A  . Number of Children: N/A  . Years of Education: N/A   Social History Main Topics  . Smoking status: Never Smoker   . Smokeless tobacco: Not on file  . Alcohol Use: Yes     Comment: occasionally  . Drug Use: No  . Sexual Activity: Not on file   Other Topics Concern  . None   Social History Narrative  . None      Review of Systems see above  Vital Signs: BP 104/54 mmHg  Pulse 75  Temp(Src) 99.9 F (37.7 C) (Oral)  Resp 18  Ht 5\' 6"  (1.676 m)  Wt 145 lb (65.772 kg)  BMI 23.41 kg/m2  SpO2  93%  Physical Exam patient awake, alert. Chest with slightly diminished breath sounds left base, right clear; heart with regular rate and rhythm. Abdomen soft, positive bowel sounds, mild tenderness left lateral region; baseball size left flank soft tissue mass with associated purpura, mildly tender to palpation; extremities with  full range of motion and no edema.  Mallampati Score:     Imaging: Dg Chest 2 View  08/21/2014   CLINICAL DATA:  59 year old female with left flank bulge/abscess  EXAM: CHEST  2 VIEW  COMPARISON:  None.  FINDINGS: The lungs are clear and negative for focal airspace consolidation, pulmonary edema or suspicious pulmonary nodule. Mild central bronchitic change. Slight elevation of the left hemidiaphragm. No pleural effusion or pneumothorax. Cardiac and mediastinal contours are within normal limits. No acute fracture or lytic or blastic osseous lesions. The visualized upper abdominal bowel gas pattern is unremarkable.  IMPRESSION: Nonspecific elevation of the left hemidiaphragm with trace left basilar subsegmental atelectasis.  Otherwise, no acute cardiopulmonary process.   Electronically Signed   By: Malachy MoanHeath  McCullough M.D.   On: 08/21/2014 19:52    Labs:  CBC:  Recent Labs  08/21/14 1756 08/22/14 0500  WBC 8.0 7.7  HGB 10.2* 9.5*  HCT 32.7* 30.5*  PLT 486* 480*    COAGS: No results for input(s): INR, APTT in the last 8760 hours.  BMP:  Recent Labs  08/21/14 1756 08/22/14 0500  NA 128* 137  K 4.1 3.7  CL 92* 103  CO2 25 26  GLUCOSE 485* 162*  BUN 11 9  CALCIUM 8.6* 8.4*  CREATININE 0.72 0.60  GFRNONAA >60 >60  GFRAA >60 >60    LIVER FUNCTION TESTS:  Recent Labs  08/21/14 1756 08/22/14 0500  BILITOT 0.4 0.4  AST 15 12*  ALT 10* 10*  ALKPHOS 104 91  PROT 7.4 7.0  ALBUMIN 2.8* 2.5*    TUMOR MARKERS: No results for input(s): AFPTM, CEA, CA199, CHROMGRNA in the last 8760 hours.  Assessment and Plan: Catherine Ali is a 59 y.o. 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 Current complaints are mild headache, and mild left flank soreness. She denies chest pain, dyspnea, nausea /vomiting, or unusual bleeding. She has had no prior surgeries. Patient was seen by urology and request has now been made for CT-guided drainage of the left perinephric and subcutaneous/ soft tissue abscesses/fluid collections. Imaging studies have been reviewed by Dr. Archer Asa and details/risks of procedure, including but not limited to, internal bleeding, infection, injury to adjacent organs, need for surgery, discussed with patient with her understanding and consent. Procedure scheduled for this morning.   Thank you for this interesting consult.  I greatly enjoyed meeting Catherine Ali and look forward to participating in their care.  Signed: D. Jeananne Rama 08/22/2014, 9:47 AM   I spent a total of 20 minutes  in face to face in clinical consultation, greater than 50% of which was counseling/coordinating care for CT-guided drainage of left perinephric/subcutaneous soft tissue abscess/fluid collections

## 2014-08-22 NOTE — Progress Notes (Signed)
ANTIBIOTIC CONSULT NOTE - INITIAL  Pharmacy Consult for Rocephin/Vancomycin Indication: Renal abscess  No Known Allergies  Patient Measurements: Height: 5\' 6"  (167.6 cm) Weight: 145 lb (65.772 kg) IBW/kg (Calculated) : 59.3   Vital Signs: Temp: 99.9 F (37.7 C) (06/07 2311) Temp Source: Oral (06/07 2311) BP: 104/54 mmHg (06/08 0605) Pulse Rate: 75 (06/08 0605) Intake/Output from previous day:   Intake/Output from this shift:    Labs:  Recent Labs  08/21/14 1756 08/22/14 0500  WBC 8.0 7.7  HGB 10.2* 9.5*  PLT 486* 480*  CREATININE 0.72 0.60   Estimated Creatinine Clearance: 71.8 mL/min (by C-G formula based on Cr of 0.6). No results for input(s): VANCOTROUGH, VANCOPEAK, VANCORANDOM, GENTTROUGH, GENTPEAK, GENTRANDOM, TOBRATROUGH, TOBRAPEAK, TOBRARND, AMIKACINPEAK, AMIKACINTROU, AMIKACIN in the last 72 hours.   Microbiology: No results found for this or any previous visit (from the past 720 hour(s)).  Medical History: History reviewed. No pertinent past medical history.  Medications:   (Not in a hospital admission) Scheduled:  . folic acid  1 mg Oral Daily  . insulin aspart  0-15 Units Subcutaneous 6 times per day  . multivitamin with minerals  1 tablet Oral Daily  . thiamine  100 mg Oral Daily  . vancomycin  750 mg Intravenous Q12H   Infusions:  . sodium chloride 75 mL/hr at 08/21/14 2154  . cefTRIAXone (ROCEPHIN)  IV    . clindamycin (CLEOCIN) IV Stopped (08/22/14 0535)   Assessment: 58 yoF c/o kidney pain.  Rocephin and Vancomycin per Rx for renal abscess.  Goal of Therapy:  Vancomycin trough level 15-20 mcg/ml  Plan:   Rocephin 1Gm IV q24h  Vancomycin 1Gm x1 ED then 750mg  IV q12h  F/u SCr/cultures/levels as needed  Susanne GreenhouseGreen, Finnley Lewis R 08/22/2014,6:35 AM

## 2014-08-22 NOTE — Progress Notes (Signed)
I have reviewed the patient's CT scan prior to admission, as well as notes from Dr. Greggory StallionMcCulloch. Although not much fluid was obtained during the initial drainage, she has had a fair amount of drainage into her 2 suction devices.  She is stable at this time. She tolerated her drain procedure well. I think it's worthwhile teaching the patient how to empty these drains. I'm fine with letting her go home in the morning if she remains stable, with follow-up with Dr. Marlou PorchHerrick on Monday of next week to plan eventual surgery. She agrees with this plan.

## 2014-08-22 NOTE — ED Notes (Signed)
Patient transported to CT 

## 2014-08-23 ENCOUNTER — Other Ambulatory Visit: Payer: Self-pay

## 2014-08-23 LAB — URINE CULTURE
COLONY COUNT: NO GROWTH
CULTURE: NO GROWTH

## 2014-08-23 LAB — GLUCOSE, CAPILLARY
GLUCOSE-CAPILLARY: 148 mg/dL — AB (ref 65–99)
Glucose-Capillary: 120 mg/dL — ABNORMAL HIGH (ref 65–99)
Glucose-Capillary: 166 mg/dL — ABNORMAL HIGH (ref 65–99)
Glucose-Capillary: 225 mg/dL — ABNORMAL HIGH (ref 65–99)

## 2014-08-23 MED ORDER — GLIPIZIDE 5 MG PO TABS: 2.5000 mg | ORAL_TABLET | Freq: Every day | ORAL | Status: DC

## 2014-08-23 MED ORDER — GLIPIZIDE 2.5 MG HALF TABLET
2.5000 mg | ORAL_TABLET | Freq: Every day | ORAL | Status: DC
  Filled 2014-08-23 (×2): qty 1

## 2014-08-23 MED ORDER — CIPROFLOXACIN HCL 500 MG PO TABS: 500.0000 mg | ORAL_TABLET | Freq: Two times a day (BID) | ORAL | Status: AC

## 2014-08-23 NOTE — Progress Notes (Signed)
Discussed discharge instructions with patient and educated patient on how to drain, recharge and flush the two JP drains. Patient understood and did teach back. Patient's vitals were stable with no complaints of pain.

## 2014-08-23 NOTE — Discharge Summary (Signed)
Catherine Ali, is a 59 y.o. female  DOB May 15, 1955  MRN [id number].  Admission date:  08/21/2014  Admitting Physician  Catherine Pax, MD  Discharge Date:  08/23/2014   Primary MD  No primary care provider on file.  Recommendations for primary care physician for things to follow:  - Patient with new diagnosis of diabetes mellitus, please follow on hemoglobin A1c, started on low dose glipizide, please continue to monitor and adjust medications accordingly. - Please follow on perinephric drain cultures and AFB results. 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 female with no significant medical history presents with a kidney infection. She states that she noted a swelling in her flank area about a month ago. Patient states that she went to the urgent care last week. Initially she was told it is a hernia. They did order a CT scan which was done in [facility]. The scan apparently is suggestive of a renal abscess. Catherine Ali she has a staghorn calculus. Patient had some lab work done and is also noted to be a diabetic. Here in the ED she has no pain noted she does  not look toxic at this time. The ED did call urology and he is going to try to get IR to possibly drain the abscess or she may need to have a nephrectomy.    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 So she presents to ED for further evaluation, seen by urology in ED, with recommendation for IR drainage, patient had drain placed with no significant output .   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 - Will need to follow on cultures, AFB and smears as outpatient  - Treated with IV vancomycin and Rocephin during hospital stay, will discharge and oral ciprofloxacin until seen by urology as an outpatient. Dr. headache this coming Tuesday, most likely will end up requiring nephrectomy.  Diabetes mellit  - Was good controlled on insulin sliding scale ,  start on low-dose glipizide on discharge   hyponatremia - Resolved,    Discharge Condition: Stable    Follow UP  Follow-up Information    Follow up with Catherine Fat, MD.   Specialty:  Urology   Why:  follow on your previously scheduled appointment    Contact information:   [address] [place] [address] [phone]         Discharge Instructions  and  Discharge Medications     Discharge Instructions    Discharge instructions    Complete by:  As directed   Follow with Primary MD No primary care provider on file. in 7 days   Get CBC, CMP, 2 view Chest X ray checked  by Primary MD next visit.    Activity: As tolerated with Full fall precautions use walker/cane &  assistance as needed   Disposition Home **   Diet: Heart Healthy , carbohydrate modified , with feeding assistance and aspiration precautions.  For Heart failure patients - Check your Weight same time everyday, if you gain over 2 pounds, or you develop in leg swelling, experience more shortness of breath or chest pain, call your Primary MD immediately. Follow Cardiac Low Salt Diet and 1.5 lit/day fluid restriction.   On your next visit with your primary care physician please Get Medicines reviewed and adjusted.   Please request your Prim.MD to go over all Hospital Tests and Procedure/Radiological results at the follow up, please get all Hospital records sent to your Prim MD by signing hospital release before you go home.   If you experience worsening of your admission symptoms, develop shortness of breath, life threatening emergency, suicidal or homicidal thoughts you must seek medical attention immediately by calling 911 or calling your MD immediately  if symptoms less severe.  You Must read complete instructions/literature along with all the possible adverse reactions/side effects for all the Medicines you take and that have been prescribed to you. Take any new Medicines after you have completely understood and accpet all the possible adverse reactions/side effects.   Do not drive, operating heavy machinery, perform activities at heights, swimming or participation in water activities or provide baby sitting services if your were admitted for syncope or siezures until you have seen by Primary MD or a Neurologist and advised to do so again.  Do not drive when taking Pain medications.    Do not take more than prescribed Pain, Sleep and Anxiety Medications  Special Instructions: If you have smoked or chewed Tobacco  in the last 2 yrs please stop smoking, stop any regular Alcohol  and or any Recreational drug use.  Wear Seat belts while driving.   Please note  You were cared for by a  hospitalist during your hospital stay. If you have any questions about your discharge medications or the care you received while you were in the hospital after you are discharged, you can call the unit and asked to speak with the hospitalist on call if the hospitalist that took care of you is not available. Once you are discharged, your primary care physician will handle any further medical issues. Please note that NO REFILLS for any discharge medications will be authorized once you are discharged, as it is imperative that you return to your primary care physician (or establish a relationship with a primary care physician if you do not have one) for your aftercare needs so that they can reassess your need for medications and monitor your lab values.  Discharge wound care:    Complete by:  As directed   once daily irrigation of drains with 5 cc's sterile NS, recording of outputs and dressing changes every 2-3 days; pt to f/u with urology next week; call [phone] or [phone] with any IR related questions. Pt instructed on how to flush drains     Increase activity slowly    Complete by:  As directed             Medication List    TAKE these medications        ciprofloxacin 500 MG tablet  Commonly known as:  CIPRO  Take 1 tablet (500 mg total) by mouth 2 (two) times daily.     glipiZIDE 5 MG tablet  Commonly known as:  GLUCOTROL  Take 0.5 tablets (2.5 mg total) by mouth daily before breakfast.     multivitamin tablet  Take 1 tablet by mouth daily.          Diet and Activity recommendation: See Discharge Instructions above   Consults obtained -    Major procedures and Radiology Reports - PLEASE review detailed and final reports for all details, in brief -      Dg Chest 2 View  08/21/2014   CLINICAL DATA:  59 year old female with left flank bulge/abscess  EXAM: CHEST  2 VIEW  COMPARISON:  None.  FINDINGS: The lungs are clear and negative for focal airspace consolidation,  pulmonary edema or suspicious pulmonary nodule. Mild central bronchitic change. Slight elevation of the left hemidiaphragm. No pleural effusion or pneumothorax. Cardiac and mediastinal contours are within normal limits. No acute fracture or lytic or blastic osseous lesions. The visualized upper abdominal bowel gas pattern is unremarkable.  IMPRESSION: Nonspecific elevation of the left hemidiaphragm with trace left basilar subsegmental atelectasis.  Otherwise, no acute cardiopulmonary process.   Electronically Signed   By: Malachy Moan M.D.   On: 08/21/2014 19:52   Ct Image Guided Drainage By Percutaneous Catheter  08/22/2014   CLINICAL DATA:  59 year old female with left-sided xanthogranulomatous pyelonephritis with rupture of xanthogranulomatous versus infectious material into the posterior perinephric space with necessitation through the left posterolateral abdominal wall. Will attempt decompression via minimally invasive percutaneous drainage prior to surgical debridement.  EXAM: CT IMAGE GUIDED DRAINAGE BY PERCUTANEOUS CATHETER; CT CORE BIOPSY RENAL  Date: 08/22/2014  PROCEDURE: 1. CT-guided placement of a 14 French drainage catheter into the left posterior perinephric fluid and gas collection. 2. CT-guided placement of a 14 French drainage catheter into the deep portion of the left flank soft tissue fluid and gas collection. Interventional Radiologist:  Catherine Big, MD  ANESTHESIA/SEDATION: Moderate (conscious) sedation was used. versed and fentanyl were administered intravenously. The patient's vital signs were monitored continuously by radiology nursing throughout the procedure.  Sedation Time: 20 minutes  MEDICATIONS: None additional  TECHNIQUE: Informed consent was obtained from the patient following explanation of the procedure, risks, benefits and alternatives. The patient understands, agrees and consents for the procedure. All questions were addressed. A time out was performed.  A planning  axial CT scan was performed. The complex multiloculated fluid and gas collection in the left perinephric space and the left posterolateral flank were both identified. A suitable skin entry sites were selected and marked.  The region was then sterilely prepped and draped in standard fashion using Betadine skin prep. Local anesthesia was attained by infiltration with 1% lidocaine.  Under intermittent CT fluoroscopic guidance, separate 18 gauge trocar needles were simultaneously advanced into both  the retroperitoneal and superficial soft tissue fluid and gas collections. Once needle tip placement was secured appropriately within the collections, attention was turned to the more anterior drainage catheter. A while was coiled within the fluid and gas collection in the tract serially dilated to 12 [place]. A Cook 12 [place] all-purpose drainage catheter was then advanced over the wire and formed with the locking loop in the fluid and gas collection. Aspiration yields a very small amounts of turbid bloody fluid. Therefore, 10 cc of saline was used to lavaged the space and this was then aspirated which yielded bloody fluid and chunks of the debris. The sample was sent for culture.  Attention was then turned to the more posterior drain. Again, a wire was coiled within the fluid and gas collection and the skin tract dilated to 14 [place]. A Cook 14 [place] all-purpose drainage catheter was then advanced over the wire and formed with the locking loop in the fluid and gas collection. Aspiration yields approximately 5 mL of turbid bloody fluid. The space was then lavaged and re- aspirated. Both drainage catheters were secured to the skin with 0 Prolene suture and adhesive fixation devices. JP bulb suction was then connected to the catheters. The patient tolerated the procedure well.  COMPLICATIONS: None  IMPRESSION: 1. Placement of a 14 French drainage catheter into the right posterior perinephric space fluid and gas collection.  Lavage and aspiration yields 10 mL bloody fluid and debris. This was sent for Gram stain, culture and AFB culture and smear. 2. Placement of a 14 French drainage catheter into the deep aspect of the left posterolateral flank fluid and gas collection. This collection is multiloculated, but the locules appear to communicate. The deep aspect was selected in an effort to control the site of necessitation.  PLAN: Given the relatively minimal aspiration despite good position of both drains, I am concerned that percutaneous drainage has a relatively low likelihood of success. Recommend continued antibiotics and follow drain output. If there is a limited clinical response, surgical debridement may be considered.  Signed,  Catherine Big, MD  [facility]  [facility]   Electronically Signed   By: Malachy Moan M.D.   On: 08/22/2014 17:22   Ct Image Guided Drainage Percut Cath  Peritoneal Retroperit  08/22/2014   CLINICAL DATA:  59 year old female with left-sided xanthogranulomatous pyelonephritis with rupture of xanthogranulomatous versus infectious material into the posterior perinephric space with necessitation through the left posterolateral abdominal wall. Will attempt decompression via minimally invasive percutaneous drainage prior to surgical debridement.  EXAM: CT IMAGE GUIDED DRAINAGE BY PERCUTANEOUS CATHETER; CT CORE BIOPSY RENAL  Date: 08/22/2014  PROCEDURE: 1. CT-guided placement of a 14 French drainage catheter into the left posterior perinephric fluid and gas collection. 2. CT-guided placement of a 14 French drainage catheter into the deep portion of the left flank soft tissue fluid and gas collection. Interventional Radiologist:  Catherine Big, MD  ANESTHESIA/SEDATION: Moderate (conscious) sedation was used. versed and fentanyl were administered intravenously. The patient's vital signs were monitored continuously by radiology nursing throughout the  procedure.  Sedation Time: 20 minutes  MEDICATIONS: None additional  TECHNIQUE: Informed consent was obtained from the patient following explanation of the procedure, risks, benefits and alternatives. The patient understands, agrees and consents for the procedure. All questions were addressed. A time out was performed.  A planning axial CT scan was performed. The complex multiloculated fluid and gas collection in the left perinephric space and the left  posterolateral flank were both identified. A suitable skin entry sites were selected and marked.  The region was then sterilely prepped and draped in standard fashion using Betadine skin prep. Local anesthesia was attained by infiltration with 1% lidocaine.  Under intermittent CT fluoroscopic guidance, separate 18 gauge trocar needles were simultaneously advanced into both the retroperitoneal and superficial soft tissue fluid and gas collections. Once needle tip placement was secured appropriately within the collections, attention was turned to the more anterior drainage catheter. A while was coiled within the fluid and gas collection in the tract serially dilated to 12 [place]. A Cook 12 [place] all-purpose drainage catheter was then advanced over the wire and formed with the locking loop in the fluid and gas collection. Aspiration yields a very small amounts of turbid bloody fluid. Therefore, 10 cc of saline was used to lavaged the space and this was then aspirated which yielded bloody fluid and chunks of the debris. The sample was sent for culture.  Attention was then turned to the more posterior drain. Again, a wire was coiled within the fluid and gas collection and the skin tract dilated to 14 [place]. A Cook 14 [place] all-purpose drainage catheter was then advanced over the wire and formed with the locking loop in the fluid and gas collection. Aspiration yields approximately 5 mL of turbid bloody fluid. The space was then lavaged and re- aspirated. Both drainage  catheters were secured to the skin with 0 Prolene suture and adhesive fixation devices. JP bulb suction was then connected to the catheters. The patient tolerated the procedure well.  COMPLICATIONS: None  IMPRESSION: 1. Placement of a 14 French drainage catheter into the right posterior perinephric space fluid and gas collection. Lavage and aspiration yields 10 mL bloody fluid and debris. This was sent for Gram stain, culture and AFB culture and smear. 2. Placement of a 14 French drainage catheter into the deep aspect of the left posterolateral flank fluid and gas collection. This collection is multiloculated, but the locules appear to communicate. The deep aspect was selected in an effort to control the site of necessitation.  PLAN: Given the relatively minimal aspiration despite good position of both drains, I am concerned that percutaneous drainage has a relatively low likelihood of success. Recommend continued antibiotics and follow drain output. If there is a limited clinical response, surgical debridement may be considered.  Signed,  Catherine Big, MD  [facility]  [facility]   Electronically Signed   By: Malachy Moan M.D.   On: 08/22/2014 17:22   Ct Outside Films Body  08/23/2014   This examination belongs to an outside facility and is stored  here for comparison purposes only.  Contact the originating outside  institution for any associated report or interpretation.   Micro Results   Results for orders placed or performed during the hospital encounter of 08/21/14  Culture, blood (routine x 2)     Status: None (Preliminary result)   Collection Time: 08/21/14  5:48 PM  Result Value Ref Range Status   Specimen Description BLOOD LEFT FATTY CASTS LEFT HAND  Final   Special Requests BOTTLES DRAWN AEROBIC AND ANAEROBIC  Final   Culture   Final           BLOOD CULTURE RECEIVED NO GROWTH TO DATE CULTURE WILL BE HELD FOR 5 DAYS BEFORE ISSUING  A FINAL NEGATIVE REPORT Performed at [facility]    Report Status PENDING  Incomplete  Culture, blood (  routine x 2)     Status: None (Preliminary result)   Collection Time: 08/21/14  5:57 PM  Result Value Ref Range Status   Specimen Description BLOOD BLOOD LEFT FOREARM  Final   Special Requests BOTTLES DRAWN AEROBIC AND ANAEROBIC  Final   Culture   Final           BLOOD CULTURE RECEIVED NO GROWTH TO DATE CULTURE WILL BE HELD FOR 5 DAYS BEFORE ISSUING A FINAL NEGATIVE REPORT Performed at [facility]    Report Status PENDING  Incomplete  Urine culture     Status: None   Collection Time: 08/21/14  7:45 PM  Result Value Ref Range Status   Specimen Description URINE, CLEAN CATCH  Final   Special Requests NONE  Final   Colony Count NO GROWTH Performed at [facility]   Final   Culture NO GROWTH Performed at [facility]   Final   Report Status 08/23/2014 FINAL  Final  Tissue culture     Status: None (Preliminary result)   Collection Time: 08/22/14 11:46 AM  Result Value Ref Range Status   Specimen Description KIDNEY LEFT POST  Final   Special Requests Normal  Final   Gram Stain   Final    MODERATE WBC PRESENT,BOTH PMN AND MONONUCLEAR NO ORGANISMS SEEN Performed at [facility]    Culture   Final    NO GROWTH 1 DAY Performed at [facility]    Report Status PENDING  Incomplete  Tissue culture     Status: None (Preliminary result)   Collection Time: 08/22/14 11:47 AM  Result Value Ref Range Status   Specimen Description KIDNEY LEFT ANT  Final   Special Requests Normal  Final   Gram Stain   Final    MODERATE WBC PRESENT,BOTH PMN AND MONONUCLEAR NO ORGANISMS SEEN Performed at [facility]    Culture   Final    NO GROWTH 1 DAY Performed at [facility]    Report Status PENDING  Incomplete  AFB culture with smear     Status: None (Preliminary result)   Collection Time: 08/22/14 11:47 AM  Result Value  Ref Range Status   Specimen Description KIDNEY LEFT ANT  Final   Special Requests Normal  Final   Acid Fast Smear   Final    NO ACID FAST BACILLI SEEN Performed at [facility]    Culture   Final    CULTURE WILL BE EXAMINED FOR 6 WEEKS BEFORE ISSUING A FINAL REPORT Performed at [facility]    Report Status PENDING  Incomplete  AFB culture with smear     Status: None (Preliminary result)   Collection Time: 08/22/14 11:48 AM  Result Value Ref Range Status   Specimen Description KIDNEY LEFT POST  Final   Special Requests Normal  Final   Acid Fast Smear   Final    NO ACID FAST BACILLI SEEN Performed at [facility]    Culture   Final    CULTURE WILL BE EXAMINED FOR 6 WEEKS BEFORE ISSUING A FINAL REPORT Performed at [facility]    Report Status PENDING  Incomplete     Recent Results (from the past 240 hour(s))  Culture, blood (routine x 2)     Status: None (Preliminary result)   Collection Time: 08/21/14  5:48 PM  Result Value Ref Range Status   Specimen Description BLOOD LEFT FATTY CASTS LEFT HAND  Final  Special Requests BOTTLES DRAWN AEROBIC AND ANAEROBIC  Final   Culture   Final           BLOOD CULTURE RECEIVED NO GROWTH TO DATE CULTURE WILL BE HELD FOR 5 DAYS BEFORE ISSUING A FINAL NEGATIVE REPORT Performed at [facility]    Report Status PENDING  Incomplete  Culture, blood (routine x 2)     Status: None (Preliminary result)   Collection Time: 08/21/14  5:57 PM  Result Value Ref Range Status   Specimen Description BLOOD BLOOD LEFT FOREARM  Final   Special Requests BOTTLES DRAWN AEROBIC AND ANAEROBIC  Final   Culture   Final           BLOOD CULTURE RECEIVED NO GROWTH TO DATE CULTURE WILL BE HELD FOR 5 DAYS BEFORE ISSUING A FINAL NEGATIVE REPORT Performed at [facility]    Report Status PENDING  Incomplete  Urine culture     Status: None   Collection Time: 08/21/14  7:45 PM  Result Value Ref Range Status    Specimen Description URINE, CLEAN CATCH  Final   Special Requests NONE  Final   Colony Count NO GROWTH Performed at [facility]   Final   Culture NO GROWTH Performed at [facility]   Final   Report Status 08/23/2014 FINAL  Final  Tissue culture     Status: None (Preliminary result)   Collection Time: 08/22/14 11:46 AM  Result Value Ref Range Status   Specimen Description KIDNEY LEFT POST  Final   Special Requests Normal  Final   Gram Stain   Final    MODERATE WBC PRESENT,BOTH PMN AND MONONUCLEAR NO ORGANISMS SEEN Performed at [facility]    Culture   Final    NO GROWTH 1 DAY Performed at [facility]    Report Status PENDING  Incomplete  Tissue culture     Status: None (Preliminary result)   Collection Time: 08/22/14 11:47 AM  Result Value Ref Range Status   Specimen Description KIDNEY LEFT ANT  Final   Special Requests Normal  Final   Gram Stain   Final    MODERATE WBC PRESENT,BOTH PMN AND MONONUCLEAR NO ORGANISMS SEEN Performed at [facility]    Culture   Final    NO GROWTH 1 DAY Performed at [facility]    Report Status PENDING  Incomplete  AFB culture with smear     Status: None (Preliminary result)   Collection Time: 08/22/14 11:47 AM  Result Value Ref Range Status   Specimen Description KIDNEY LEFT ANT  Final   Special Requests Normal  Final   Acid Fast Smear   Final    NO ACID FAST BACILLI SEEN Performed at [facility]    Culture   Final    CULTURE WILL BE EXAMINED FOR 6 WEEKS BEFORE ISSUING A FINAL REPORT Performed at [facility]    Report Status PENDING  Incomplete  AFB culture with smear     Status: None (Preliminary result)   Collection Time: 08/22/14 11:48 AM  Result Value Ref Range Status   Specimen Description KIDNEY LEFT POST  Final   Special Requests Normal  Final   Acid Fast Smear   Final    NO ACID FAST BACILLI SEEN Performed at [facility]    Culture   Final     CULTURE WILL BE EXAMINED FOR 6 WEEKS BEFORE ISSUING A FINAL REPORT Performed  at [facility]    Report Status PENDING  Incomplete       Today   Subjective:   Catherine Ali today has no headache,no chest abdominal pain,no new weakness tingling or numbness, feels much better wants to go home.  Objective:   Blood pressure 123/67, pulse 87, temperature 98.5 F (36.9 C), temperature source Oral, resp. rate 16, height 5\' 6"  (1.676 m), weight 65.772 kg (145 lb), SpO2 98 %.   Intake/Output Summary (Last 24 hours) at 08/23/14 1350 Last data filed at 08/23/14 1336  Gross per 24 hour  Intake 2932.5 ml  Output   3715 ml  Net -782.5 ml    Exam Awake Alert, Oriented X 3, No new F.N deficits, Normal affect [place].AT,PERRAL Supple Neck,No JVD, No cervical lymphadenopathy appriciated.  Symmetrical Chest wall movement, Good air movement bilaterally,  RRR,No Gallops,Rubs or new Murmurs, No Parasternal Heave +ve B.Sounds, Abd Soft, No tenderness, No organomegaly appriciated, No rebound - guarding or rigidity. 2 drains originated from left flank with minimal . No Cyanosanguinous material sis, Clubbing or edema, No new Rash or bruise   Data Review   CBC w Diff: Lab Results  Component Value Date   WBC 7.7 08/22/2014   HGB 9.5* 08/22/2014   HCT 30.5* 08/22/2014   PLT 480* 08/22/2014   LYMPHOPCT 15 08/21/2014   MONOPCT 8 08/21/2014   EOSPCT 1 08/21/2014   BASOPCT 1 08/21/2014    CMP: Lab Results  Component Value Date   NA 137 08/22/2014   K 3.7 08/22/2014   CL 103 08/22/2014   CO2 26 08/22/2014   BUN 9 08/22/2014   CREATININE 0.60 08/22/2014   PROT 7.0 08/22/2014   ALBUMIN 2.5* 08/22/2014   BILITOT 0.4 08/22/2014   ALKPHOS 91 08/22/2014   AST 12* 08/22/2014   ALT 10* 08/22/2014  .   Total Time in preparing paper work, data evaluation and todays exam - 35 minutes  ELGERGAWY, DAWOOD M.D on 08/23/2014 at 1:50 PM  [facility]   Office   [phone]

## 2014-08-23 NOTE — Progress Notes (Signed)
Patient ID: Catherine Ali, female   DOB: 1956-02-20, 59 y.o.   MRN: [id number]    Referring Physician(s): Herrick,B  Subjective:  Pt doing well; still has some left flank discomfort; tol diet ok, denies N/V  Allergies: Review of patient's allergies indicates no known allergies.  Medications: Prior to Admission medications   Medication Sig Start Date End Date Taking? Authorizing Provider  Multiple Vitamin (MULTIVITAMIN) tablet Take 1 tablet by mouth daily.   Yes Historical Provider, MD     Vital Signs: BP 121/68 mmHg  Pulse 87  Temp(Src) 99.3 F (37.4 C) (Oral)  Resp 16  Ht  (1.676 m)  Wt 145 lb (65.772 kg)  BMI 23.41 kg/m2  SpO2 97%  Physical Exam left flank drains intact, insertion sites ok, mildly tender, outputs 5-10 cc's each bloody fluid, approx 40-45 cc's yesterday; cx's pend  Imaging: Dg Chest 2 View  08/21/2014   CLINICAL DATA:  59 year old female with left flank bulge/abscess  EXAM: CHEST  2 VIEW  COMPARISON:  None.  FINDINGS: The lungs are clear and negative for focal airspace consolidation, pulmonary edema or suspicious pulmonary nodule. Mild central bronchitic change. Slight elevation of the left hemidiaphragm. No pleural effusion or pneumothorax. Cardiac and mediastinal contours are within normal limits. No acute fracture or lytic or blastic osseous lesions. The visualized upper abdominal bowel gas pattern is unremarkable.  IMPRESSION: Nonspecific elevation of the left hemidiaphragm with trace left basilar subsegmental atelectasis.  Otherwise, no acute cardiopulmonary process.   Electronically Signed   By: Malachy Moan M.D.   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 Will attempt decompression via minimally invasive percutaneous drainage prior to surgical debridement.  EXAM: CT IMAGE GUIDED DRAINAGE BY PERCUTANEOUS CATHETER; CT CORE BIOPSY RENAL  Date: 08/22/2014  PROCEDURE: 1. CT-guided placement of a 14 French drainage catheter into the left posterior perinephric fluid and gas collection. 2. CT-guided placement of a 14 French drainage catheter into the deep portion of the left flank soft tissue fluid and gas collection. Interventional Radiologist:  Sterling Big, MD  ANESTHESIA/SEDATION: Moderate (conscious) sedation was used. versed and fentanyl were administered intravenously. The patient's vital signs were monitored continuously by radiology nursing throughout the procedure.  Sedation Time: 20 minutes  MEDICATIONS: None additional  TECHNIQUE: Informed consent was obtained from the patient following explanation of the procedure, risks, benefits and alternatives. The patient understands, agrees and consents for the procedure. All questions were addressed. A time out was performed.  A planning axial CT scan was performed. The complex multiloculated fluid and gas collection in the left perinephric space and the left posterolateral flank were both identified. A suitable skin entry sites were selected and marked.  The region was then sterilely prepped and draped in standard fashion using Betadine skin prep. Local anesthesia was attained by infiltration with 1% lidocaine.  Under intermittent CT fluoroscopic guidance, separate 18 gauge trocar needles were simultaneously advanced into both the retroperitoneal and superficial soft tissue fluid and gas collections. Once needle tip placement was secured appropriately within the collections, attention was turned to the more anterior drainage catheter. A while was coiled within the fluid and gas collection in the tract serially dilated to 12 [place]. A Cook 12 [place] all-purpose drainage catheter was then advanced over the  wire  and formed with the locking loop in the fluid and gas collection. Aspiration yields a very small amounts of turbid bloody fluid. Therefore, 10 cc of saline was used to lavaged the space and this was then aspirated which yielded bloody fluid and chunks of the debris. The sample was sent for culture.  Attention was then turned to the more posterior drain. Again, a wire was coiled within the fluid and gas collection and the skin tract dilated to 14 [place]. A Cook 14 [place] all-purpose drainage catheter was then advanced over the wire and formed with the locking loop in the fluid and gas collection. Aspiration yields approximately 5 mL of turbid bloody fluid. The space was then lavaged and re- aspirated. Both drainage catheters were secured to the skin with 0 Prolene suture and adhesive fixation devices. JP bulb suction was then connected to the catheters. The patient tolerated the procedure well.  COMPLICATIONS: None  IMPRESSION: 1. Placement of a 14 French drainage catheter into the right posterior perinephric space fluid and gas collection. Lavage and aspiration yields 10 mL bloody fluid and debris. This was sent for Gram stain, culture and AFB culture and smear. 2. Placement of a 14 French drainage catheter into the deep aspect of the left posterolateral flank fluid and gas collection. This collection is multiloculated, but the locules appear to communicate. The deep aspect was selected in an effort to control the site of necessitation.  PLAN: Given the relatively minimal aspiration despite good position of both drains, I am concerned that percutaneous drainage has a relatively low likelihood of success. Recommend continued antibiotics and follow drain output. If there is a limited clinical response, surgical debridement may be considered.  Signed,  Sterling Big, MD  [facility]  [facility]   Electronically Signed   By: Malachy Moan M.D.   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 Will attempt decompression via minimally invasive percutaneous drainage prior to surgical debridement.  EXAM: CT IMAGE GUIDED DRAINAGE BY PERCUTANEOUS CATHETER; CT CORE BIOPSY RENAL  Date: 08/22/2014  PROCEDURE: 1. CT-guided placement of a 14 French drainage catheter into the left posterior perinephric fluid and gas collection. 2. CT-guided placement of a 14 French drainage catheter into the deep portion of the left flank soft tissue fluid and gas collection. Interventional Radiologist:  Sterling Big, MD  ANESTHESIA/SEDATION: Moderate (conscious) sedation was used. versed and fentanyl were administered intravenously. The patient's vital signs were monitored continuously by radiology nursing throughout the procedure.  Sedation Time: 20 minutes  MEDICATIONS: None additional  TECHNIQUE: Informed consent was obtained from the patient following explanation of the procedure, risks, benefits and alternatives. The patient understands, agrees and consents for the procedure. All questions were addressed. A time out was performed.  A planning axial CT scan was performed. The complex multiloculated fluid and gas collection in the left perinephric space and the left posterolateral flank were both identified. A suitable skin entry sites were selected and marked.  The region was then sterilely prepped and draped in standard fashion using Betadine skin prep. Local anesthesia was attained by infiltration with 1% lidocaine.  Under intermittent CT fluoroscopic guidance, separate 18 gauge trocar needles were simultaneously advanced into both the retroperitoneal and superficial  soft tissue fluid and  gas collections. Once needle tip placement was secured appropriately within the collections, attention was turned to the more anterior drainage catheter. A while was coiled within the fluid and gas collection in the tract serially dilated to 12 [place]. A Cook 12 [place] all-purpose drainage catheter was then advanced over the wire and formed with the locking loop in the fluid and gas collection. Aspiration yields a very small amounts of turbid bloody fluid. Therefore, 10 cc of saline was used to lavaged the space and this was then aspirated which yielded bloody fluid and chunks of the debris. The sample was sent for culture.  Attention was then turned to the more posterior drain. Again, a wire was coiled within the fluid and gas collection and the skin tract dilated to 14 [place]. A Cook 14 [place] all-purpose drainage catheter was then advanced over the wire and formed with the locking loop in the fluid and gas collection. Aspiration yields approximately 5 mL of turbid bloody fluid. The space was then lavaged and re- aspirated. Both drainage catheters were secured to the skin with 0 Prolene suture and adhesive fixation devices. JP bulb suction was then connected to the catheters. The patient tolerated the procedure well.  COMPLICATIONS: None  IMPRESSION: 1. Placement of a 14 French drainage catheter into the right posterior perinephric space fluid and gas collection. Lavage and aspiration yields 10 mL bloody fluid and debris. This was sent for Gram stain, culture and AFB culture and smear. 2. Placement of a 14 French drainage catheter into the deep aspect of the left posterolateral flank fluid and gas collection. This collection is multiloculated, but the locules appear to communicate. The deep aspect was selected in an effort to control the site of necessitation.  PLAN: Given the relatively minimal aspiration despite good position of both drains, I am concerned that percutaneous drainage has a relatively low  likelihood of success. Recommend continued antibiotics and follow drain output. If there is a limited clinical response, surgical debridement may be considered.  Signed,  Sterling Big, MD  [facility]  [facility]   Electronically Signed   By: Malachy Moan M.D.   On: 08/22/2014 17:22   Ct Outside Films Body  08/23/2014   This examination belongs to an outside facility and is stored  here for comparison purposes only.  Contact the originating outside  institution for any associated report or interpretation.   Labs:  CBC:  Recent Labs  08/21/14 1756 08/22/14 0500  WBC 8.0 7.7  HGB 10.2* 9.5*  HCT 32.7* 30.5*  PLT 486* 480*    COAGS: No results for input(s): INR, APTT in the last 8760 hours.  BMP:  Recent Labs  08/21/14 1756 08/22/14 0500  NA 128* 137  K 4.1 3.7  CL 92* 103  CO2 25 26  GLUCOSE 485* 162*  BUN 11 9  CALCIUM 8.6* 8.4*  CREATININE 0.72 0.60  GFRNONAA >60 >60  GFRAA >60 >60    LIVER FUNCTION TESTS:  Recent Labs  08/21/14 1756 08/22/14 0500  BILITOT 0.4 0.4  AST 15 12*  ALT 10* 10*  ALKPHOS 104 91  PROT 7.4 7.0  ALBUMIN 2.8* 2.5*    Assessment and Plan: S/p left perinephric/RP/left flank ST fluid collection drainages 6/8 secondary to XGP; check final cx's; temp 99.3; no new labs; once d/c'd home rec once daily irrigation of drains with 5 cc's sterile NS, recording of outputs and dressing changes every 2-3 days;  pt to f/u with urology next week; call [phone] or [phone] with any IR related questions. Pt instructed on how to flush drains.   Signed: D. Jeananne Rama 08/23/2014, 1:01 PM   I spent a total of 15 minutes in face to face in clinical consultation/evaluation, greater than 50% of which was counseling/coordinating care for left flank /RP fluid collection drains

## 2014-08-23 NOTE — Care Management Note (Signed)
Case Management Note  Patient Details  Name: Riyaq Rylant MRN: [id number] Date of Birth: June 03, 1955  Subjective/Objective:          Admitted with renal abscess s/p drain          Action/Plan: Discharge planning, spoke with patient at bedside. Patient has a PCP, was scheduled to see PCP but was hospitalized prior to appt. Patient will reschedule that appt. No preference for [facility] agency, contacted [facility] to arrange RN for dressing changes.   Expected Discharge Date:   (unknown)               Expected Discharge Plan:  Home w Home Health Services  In-House Referral:  NA  Discharge planning Services  CM Consult  Post Acute Care Choice:    Choice offered to:     DME Arranged:    DME Agency:     HH Arranged:  RN HH Agency:  [facility]  Status of Service:  Completed, signed off  Medicare Important Message Given:  No Date Medicare IM Given:    Medicare IM give by:    Date Additional Medicare IM Given:    Additional Medicare Important Message give by:     If discussed at Long Length of Stay Meetings, dates discussed:    Additional Comments:  Alexis Goodell, RN 08/23/2014, 1:48 PM

## 2014-08-25 LAB — TISSUE CULTURE
Special Requests: NORMAL
Special Requests: NORMAL

## 2014-08-27 LAB — HEMOGLOBIN A1C
HEMOGLOBIN A1C: 14.5 %
Mean Plasma Glucose: 369

## 2014-08-28 ENCOUNTER — Other Ambulatory Visit: Payer: Self-pay | Admitting: Urology

## 2014-08-28 DIAGNOSIS — N118 Other chronic tubulo-interstitial nephritis: Secondary | ICD-10-CM

## 2014-08-28 LAB — CULTURE, BLOOD (ROUTINE X 2)
CULTURE: NO GROWTH
Culture: NO GROWTH

## 2014-08-30 ENCOUNTER — Ambulatory Visit (HOSPITAL_COMMUNITY)
Admission: RE | Admit: 2014-08-30 | Discharge: 2014-08-30 | Disposition: A | Discharge status: Home / Self Care | Payer: BLUE CROSS/BLUE SHIELD | Source: Ambulatory Visit | Attending: Urology | Admitting: Urology

## 2014-08-30 DIAGNOSIS — N12 Tubulo-interstitial nephritis, not specified as acute or chronic: Secondary | ICD-10-CM | POA: Diagnosis not present

## 2014-08-30 DIAGNOSIS — N118 Other chronic tubulo-interstitial nephritis: Secondary | ICD-10-CM

## 2014-08-30 MED ORDER — TECHNETIUM TC 99M MERTIATIDE
15.2000 | Freq: Once | INTRAVENOUS | Status: AC | PRN
  Administered 2014-08-30: 15 via INTRAVENOUS

## 2014-08-30 MED ORDER — FUROSEMIDE 10 MG/ML IJ SOLN
33.0000 mg | Freq: Once | INTRAMUSCULAR | Status: AC
  Administered 2014-08-30: 33 mg via INTRAVENOUS
  Filled 2014-08-30: qty 4

## 2014-09-03 ENCOUNTER — Other Ambulatory Visit: Payer: Self-pay | Admitting: Urology

## 2014-09-03 NOTE — Progress Notes (Signed)
Called for release of orders in Epic surgery 09-11-14 pre op 09-07-14 Thanks

## 2014-09-07 ENCOUNTER — Ambulatory Visit (HOSPITAL_COMMUNITY)
Admission: RE | Admit: 2014-09-07 | Discharge: 2014-09-07 | Disposition: A | Discharge status: Home / Self Care | Payer: BLUE CROSS/BLUE SHIELD | Source: Ambulatory Visit | Attending: Anesthesiology | Admitting: Anesthesiology

## 2014-09-07 ENCOUNTER — Encounter (HOSPITAL_COMMUNITY): Payer: Self-pay

## 2014-09-07 ENCOUNTER — Encounter (HOSPITAL_COMMUNITY)
Admission: RE | Admit: 2014-09-07 | Discharge: 2014-09-07 | Disposition: A | Discharge status: Home / Self Care | Payer: BLUE CROSS/BLUE SHIELD | Source: Ambulatory Visit | Attending: Urology | Admitting: Urology

## 2014-09-07 DIAGNOSIS — I10 Essential (primary) hypertension: Secondary | ICD-10-CM | POA: Diagnosis not present

## 2014-09-07 DIAGNOSIS — R9389 Abnormal findings on diagnostic imaging of other specified body structures: Secondary | ICD-10-CM

## 2014-09-07 DIAGNOSIS — R938 Abnormal findings on diagnostic imaging of other specified body structures: Secondary | ICD-10-CM | POA: Insufficient documentation

## 2014-09-07 HISTORY — DX: Unspecified fracture of skull, initial encounter for closed fracture: S02.91XA

## 2014-09-07 HISTORY — DX: Anemia, unspecified: D64.9

## 2014-09-07 HISTORY — DX: Hypo-osmolality and hyponatremia: E87.1

## 2014-09-07 HISTORY — DX: Renal and perinephric abscess: N15.1

## 2014-09-07 HISTORY — DX: Other chronic tubulo-interstitial nephritis: N11.8

## 2014-09-07 HISTORY — DX: Anxiety disorder, unspecified: F41.9

## 2014-09-07 HISTORY — DX: Chronic kidney disease, unspecified: N18.9

## 2014-09-07 HISTORY — DX: Type 2 diabetes mellitus without complications: E11.9

## 2014-09-07 HISTORY — DX: Calculus of kidney: N20.0

## 2014-09-07 LAB — BASIC METABOLIC PANEL
ANION GAP: 7 (ref 5–15)
BUN: 12 mg/dL (ref 6–20)
CALCIUM: 8.9 mg/dL (ref 8.9–10.3)
CO2: 26 mmol/L (ref 22–32)
CREATININE: 0.8 mg/dL (ref 0.44–1.00)
Chloride: 101 mmol/L (ref 101–111)
GFR calc Af Amer: >60 mL/min (ref >60)
GFR calc non Af Amer: >60 mL/min (ref >60)
Glucose, Bld: 189 mg/dL — ABNORMAL HIGH (ref 65–99)
Potassium: 4.1 mmol/L (ref 3.5–5.1)
Sodium: 134 mmol/L — ABNORMAL LOW (ref 135–145)

## 2014-09-07 LAB — CBC
HCT: 33.3 % — ABNORMAL LOW (ref 36.0–46.0)
Hemoglobin: 10.4 g/dL — ABNORMAL LOW (ref 12.0–15.0)
MCH: 23.9 pg — ABNORMAL LOW (ref 26.0–34.0)
MCHC: 31.2 g/dL (ref 30.0–36.0)
MCV: 76.4 fL — ABNORMAL LOW (ref 78.0–100.0)
PLATELETS: 479 10*3/uL — AB (ref 150–400)
RBC: 4.36 MIL/uL (ref 3.87–5.11)
RDW: 14.8 % (ref 11.5–15.5)
WBC: 10.4 10*3/uL (ref 4.0–10.5)

## 2014-09-07 LAB — ABO/RH: ABO/RH(D): A POS

## 2014-09-07 NOTE — Progress Notes (Signed)
Note per chart per Dr Duanne Guess 09/03/2014

## 2014-09-07 NOTE — Patient Instructions (Addendum)
Guy Canet  09/07/2014   Your procedure is scheduled on: Tuesday September 11, 2014   Report to [facility]  Entrance take [address]  elevators to 3rd floor to  Short Stay Center at 8:45 AM.  Call this number if you have problems the morning of surgery [phone]   Remember: ONLY 1 PERSON MAY GO WITH YOU TO SHORT STAY TO GET  READY MORNING OF YOUR SURGERY.  Do not eat food or drink liquids :After Midnight.                Follow MD instructions in regards to bowel program.      Take these medicines the morning of surgery with A SIP OF WATER: NONE                               You may not have any metal on your body including hair pins and              piercings  Do not wear jewelry, makeup, lotions, powders or perfumes, deodorant             Do not wear nail polish.  Do not shave  48 hours prior to surgery.                 Do not bring valuables to the hospital. [facility] IS NOT             RESPONSIBLE   FOR VALUABLES.  Contacts, dentures or bridgework may not be worn into surgery.  Leave suitcase in the car. After surgery it may be brought to your room.                  Please read over the following fact sheets you were given:BLOOD TRANSFUSION INFORMATION SHEET  _____________________________________________________________________             [facility] - Preparing for Surgery Before surgery, you can play an important role.  Because skin is not sterile, your skin needs to be as free of germs as possible.  You can reduce the number of germs on your skin by washing with CHG (chlorahexidine gluconate) soap before surgery.  CHG is an antiseptic cleaner which kills germs and bonds with the skin to continue killing germs even after washing. Please DO NOT use if you have an allergy to CHG or antibacterial soaps.  If your skin becomes reddened/irritated stop using the CHG and inform your nurse when you arrive at Short Stay. Do not shave (including legs and  underarms) for at least 48 hours prior to the first CHG shower.  You may shave your face/neck. Please follow these instructions carefully:  1.  Shower with CHG Soap the night before surgery and the  morning of Surgery.  2.  If you choose to wash your hair, wash your hair first as usual with your  normal  shampoo.  3.  After you shampoo, rinse your hair and body thoroughly to remove the  shampoo.                           4.  Use CHG as you would any other liquid soap.  You can apply chg directly  to the skin and wash  Gently with a scrungie or clean washcloth.  5.  Apply the CHG Soap to your body ONLY FROM THE NECK DOWN.   Do not use on face/ open                           Wound or open sores. Avoid contact with eyes, ears mouth and genitals (private parts).                       Wash face,  Genitals (private parts) with your normal soap.             6.  Wash thoroughly, paying special attention to the area where your surgery  will be performed.  7.  Thoroughly rinse your body with warm water from the neck down.  8.  DO NOT shower/wash with your normal soap after using and rinsing off  the CHG Soap.                9.  Pat yourself dry with a clean towel.            10.  Wear clean pajamas.            11.  Place clean sheets on your bed the night of your first shower and do not  sleep with pets. Day of Surgery : Do not apply any lotions/deodorants the morning of surgery.  Please wear clean clothes to the hospital/surgery center.  FAILURE TO FOLLOW THESE INSTRUCTIONS MAY RESULT IN THE CANCELLATION OF YOUR SURGERY PATIENT SIGNATURE_________________________________  NURSE SIGNATURE__________________________________  ________________________________________________________________________  WHAT IS A BLOOD TRANSFUSION? Blood Transfusion Information  A transfusion is the replacement of blood or some of its parts. Blood is made up of multiple cells which provide different  functions.  Red blood cells carry oxygen and are used for blood loss replacement.  White blood cells fight against infection.  Platelets control bleeding.  Plasma helps clot blood.  Other blood products are available for specialized needs, such as hemophilia or other clotting disorders. BEFORE THE TRANSFUSION  Who gives blood for transfusions?   Healthy volunteers who are fully evaluated to make sure their blood is safe. This is blood bank blood. Transfusion therapy is the safest it has ever been in the practice of medicine. Before blood is taken from a donor, a complete history is taken to make sure that person has no history of diseases nor engages in risky social behavior (examples are intravenous drug use or sexual activity with multiple partners). The donor's travel history is screened to minimize risk of transmitting infections, such as malaria. The donated blood is tested for signs of infectious diseases, such as HIV and hepatitis. The blood is then tested to be sure it is compatible with you in order to minimize the chance of a transfusion reaction. If you or a relative donates blood, this is often done in anticipation of surgery and is not appropriate for emergency situations. It takes many days to process the donated blood. RISKS AND COMPLICATIONS Although transfusion therapy is very safe and saves many lives, the main dangers of transfusion include:   Getting an infectious disease.  Developing a transfusion reaction. This is an allergic reaction to something in the blood you were given. Every precaution is taken to prevent this. The decision to have a blood transfusion has been considered carefully by your caregiver before blood is given. Blood is not given unless the benefits outweigh  the risks. AFTER THE TRANSFUSION  Right after receiving a blood transfusion, you will usually feel much better and more energetic. This is especially true if your red blood cells have gotten low  (anemic). The transfusion raises the level of the red blood cells which carry oxygen, and this usually causes an energy increase.  The nurse administering the transfusion will monitor you carefully for complications. HOME CARE INSTRUCTIONS  No special instructions are needed after a transfusion. You may find your energy is better. Speak with your caregiver about any limitations on activity for underlying diseases you may have. SEEK MEDICAL CARE IF:   Your condition is not improving after your transfusion.  You develop redness or irritation at the intravenous (IV) site. SEEK IMMEDIATE MEDICAL CARE IF:  Any of the following symptoms occur over the next 12 hours:  Shaking chills.  You have a temperature by mouth above 102 F (38.9 C), not controlled by medicine.  Chest, back, or muscle pain.  People around you feel you are not acting correctly or are confused.  Shortness of breath or difficulty breathing.  Dizziness and fainting.  You get a rash or develop hives.  You have a decrease in urine output.  Your urine turns a dark color or changes to pink, red, or brown. Any of the following symptoms occur over the next 10 days:  You have a temperature by mouth above 102 F (38.9 C), not controlled by medicine.  Shortness of breath.  Weakness after normal activity.  The white part of the eye turns yellow (jaundice).  You have a decrease in the amount of urine or are urinating less often.  Your urine turns a dark color or changes to pink, red, or brown. Document Released: 02/28/2000 Document Revised: 05/25/2011 Document Reviewed: 10/17/2007 [facility] Patient Information 2014 [place], [place].  _______________________________________________________________________

## 2014-09-07 NOTE — Progress Notes (Signed)
CBC results in epic per PAT visit 09/07/2014 sent to Dr Marlou Porch

## 2014-09-10 NOTE — Progress Notes (Signed)
During PAT visit 09/07/2014 pt stated Dr Duanne Guess was to begin her on 2 units of insulin daily but pt was not sure of type of insulin. Pt was to contact this nurse back with info on medication but did not hear back on 09/07/2014. This nurse contacted pt 09/10/2014 to update medication list but pt states she is not going to begin new insulin till after surgery.  This nurse spoke with Dr Jenell Milliner in regards to pts EKG from 09/07/2014. No orders given. Anesthesia to see pt day of surgery.

## 2014-09-11 ENCOUNTER — Encounter (HOSPITAL_COMMUNITY)
Admission: AD | Disposition: A | Discharge status: Home / Self Care | Payer: Self-pay | Source: Ambulatory Visit | Attending: Urology

## 2014-09-11 ENCOUNTER — Inpatient Hospital Stay (HOSPITAL_COMMUNITY): Payer: BLUE CROSS/BLUE SHIELD | Admitting: Certified Registered"

## 2014-09-11 ENCOUNTER — Encounter (HOSPITAL_COMMUNITY): Payer: Self-pay | Admitting: *Deleted

## 2014-09-11 ENCOUNTER — Inpatient Hospital Stay (HOSPITAL_COMMUNITY)
Admission: AD | Admit: 2014-09-11 | Discharge: 2014-09-15 | DRG: 661 | Disposition: A | Discharge status: Home / Self Care | Payer: BLUE CROSS/BLUE SHIELD | Source: Ambulatory Visit | Attending: Urology | Admitting: Urology

## 2014-09-11 DIAGNOSIS — E119 Type 2 diabetes mellitus without complications: Secondary | ICD-10-CM | POA: Diagnosis present

## 2014-09-11 DIAGNOSIS — N118 Other chronic tubulo-interstitial nephritis: Secondary | ICD-10-CM | POA: Diagnosis present

## 2014-09-11 DIAGNOSIS — Z79899 Other long term (current) drug therapy: Secondary | ICD-10-CM

## 2014-09-11 DIAGNOSIS — Z823 Family history of stroke: Secondary | ICD-10-CM

## 2014-09-11 DIAGNOSIS — K660 Peritoneal adhesions (postprocedural) (postinfection): Secondary | ICD-10-CM | POA: Diagnosis present

## 2014-09-11 DIAGNOSIS — Z801 Family history of malignant neoplasm of trachea, bronchus and lung: Secondary | ICD-10-CM | POA: Diagnosis not present

## 2014-09-11 HISTORY — PX: LAPAROSCOPIC NEPHRECTOMY: SHX1930

## 2014-09-11 LAB — PHOSPHORUS: PHOSPHORUS: 5 mg/dL — AB (ref 2.5–4.6)

## 2014-09-11 LAB — BASIC METABOLIC PANEL
Anion gap: 8 (ref 5–15)
BUN: 15 mg/dL (ref 6–20)
CO2: 24 mmol/L (ref 22–32)
Calcium: 8.2 mg/dL — ABNORMAL LOW (ref 8.9–10.3)
Chloride: 104 mmol/L (ref 101–111)
Creatinine, Ser: 0.8 mg/dL (ref 0.44–1.00)
GFR calc Af Amer: >60 mL/min (ref >60)
GFR calc non Af Amer: >60 mL/min (ref >60)
GLUCOSE: 212 mg/dL — AB (ref 65–99)
POTASSIUM: 4.8 mmol/L (ref 3.5–5.1)
Sodium: 136 mmol/L (ref 135–145)

## 2014-09-11 LAB — TYPE AND SCREEN
ABO/RH(D): A POS
ANTIBODY SCREEN: NEGATIVE

## 2014-09-11 LAB — GLUCOSE, CAPILLARY
Glucose-Capillary: 92 mg/dL (ref 65–99)
Glucose-Capillary: 199 mg/dL — ABNORMAL HIGH (ref 65–99)

## 2014-09-11 LAB — CBC
HEMATOCRIT: 31 % — AB (ref 36.0–46.0)
HEMOGLOBIN: 9.6 g/dL — AB (ref 12.0–15.0)
MCH: 24.2 pg — ABNORMAL LOW (ref 26.0–34.0)
MCHC: 31 g/dL (ref 30.0–36.0)
MCV: 78.1 fL (ref 78.0–100.0)
Platelets: 395 10*3/uL (ref 150–400)
RBC: 3.97 MIL/uL (ref 3.87–5.11)
RDW: 14.6 % (ref 11.5–15.5)
WBC: 11.1 10*3/uL — AB (ref 4.0–10.5)

## 2014-09-11 LAB — MAGNESIUM: Magnesium: 1.8 mg/dL (ref 1.7–2.4)

## 2014-09-11 SURGERY — NEPHRECTOMY, RADICAL, LAPAROSCOPIC, ADULT
Anesthesia: General | Laterality: Left

## 2014-09-11 MED ORDER — DOCUSATE SODIUM 100 MG PO CAPS
100.0000 mg | ORAL_CAPSULE | Freq: Two times a day (BID) | ORAL | Status: DC
  Filled 2014-09-11 (×3): qty 1

## 2014-09-11 MED ORDER — ONDANSETRON HCL 4 MG/2ML IJ SOLN
INTRAMUSCULAR | Status: DC | PRN
  Administered 2014-09-11: 4 mg via INTRAVENOUS

## 2014-09-11 MED ORDER — SODIUM CHLORIDE 0.9 % IV SOLN
1.5000 g | Freq: Once | INTRAVENOUS | Status: DC
  Filled 2014-09-11 (×2): qty 1.5

## 2014-09-11 MED ORDER — ROCURONIUM BROMIDE 100 MG/10ML IV SOLN
INTRAVENOUS | Status: AC
  Filled 2014-09-11: qty 1

## 2014-09-11 MED ORDER — SUCCINYLCHOLINE CHLORIDE 20 MG/ML IJ SOLN
INTRAMUSCULAR | Status: DC | PRN
  Administered 2014-09-11: 100 mg via INTRAVENOUS
  Administered 2014-09-11: 40 mg via INTRAVENOUS

## 2014-09-11 MED ORDER — PROPOFOL 10 MG/ML IV BOLUS
INTRAVENOUS | Status: DC | PRN
  Administered 2014-09-11: 150 mg via INTRAVENOUS
  Administered 2014-09-11: 50 mg via INTRAVENOUS

## 2014-09-11 MED ORDER — MIDAZOLAM HCL 2 MG/2ML IJ SOLN
INTRAMUSCULAR | Status: AC
  Filled 2014-09-11: qty 2

## 2014-09-11 MED ORDER — LACTATED RINGERS IV BOLUS (SEPSIS)
500.0000 mL | Freq: Once | INTRAVENOUS | Status: AC
  Administered 2014-09-11: 500 mL via INTRAVENOUS

## 2014-09-11 MED ORDER — DEXAMETHASONE SODIUM PHOSPHATE 10 MG/ML IJ SOLN
INTRAMUSCULAR | Status: AC
  Filled 2014-09-11: qty 1

## 2014-09-11 MED ORDER — LACTATED RINGERS IV SOLN
INTRAVENOUS | Status: DC | PRN
Start: 2014-09-11 — End: 2014-09-11
  Administered 2014-09-11: 11:00:00 via INTRAVENOUS

## 2014-09-11 MED ORDER — BUPIVACAINE HCL 0.25 % IJ SOLN
INTRAMUSCULAR | Status: DC | PRN
  Administered 2014-09-11: 10 mL

## 2014-09-11 MED ORDER — MORPHINE SULFATE 2 MG/ML IJ SOLN
2.0000 mg | INTRAMUSCULAR | Status: DC | PRN
  Administered 2014-09-12: 2 mg via INTRAVENOUS
  Filled 2014-09-11: qty 1

## 2014-09-11 MED ORDER — EPHEDRINE SULFATE 50 MG/ML IJ SOLN
INTRAMUSCULAR | Status: DC | PRN
  Administered 2014-09-11: 10 mg via INTRAVENOUS

## 2014-09-11 MED ORDER — SODIUM CHLORIDE 0.9 % IV BOLUS (SEPSIS): 500.0000 mL | Freq: Once | INTRAVENOUS | Status: DC

## 2014-09-11 MED ORDER — INSULIN ASPART 100 UNIT/ML ~~LOC~~ SOLN
0.0000 [IU] | SUBCUTANEOUS | Status: DC
  Administered 2014-09-12: 1 [IU] via SUBCUTANEOUS
  Administered 2014-09-12: 2 [IU] via SUBCUTANEOUS
  Administered 2014-09-12 – 2014-09-13 (×2): 1 [IU] via SUBCUTANEOUS
  Administered 2014-09-13 (×2): 2 [IU] via SUBCUTANEOUS
  Administered 2014-09-14 (×2): 1 [IU] via SUBCUTANEOUS
  Administered 2014-09-15 (×2): 2 [IU] via SUBCUTANEOUS

## 2014-09-11 MED ORDER — ACETAMINOPHEN 10 MG/ML IV SOLN: 1000.0000 mg | Freq: Four times a day (QID) | INTRAVENOUS | Status: DC

## 2014-09-11 MED ORDER — HYDROMORPHONE HCL 1 MG/ML IJ SOLN
INTRAMUSCULAR | Status: DC | PRN
  Administered 2014-09-11: 1 mg via INTRAVENOUS

## 2014-09-11 MED ORDER — FENTANYL CITRATE (PF) 250 MCG/5ML IJ SOLN
INTRAMUSCULAR | Status: AC
  Filled 2014-09-11: qty 5

## 2014-09-11 MED ORDER — OXYCODONE-ACETAMINOPHEN 5-325 MG PO TABS: 1.0000 | ORAL_TABLET | ORAL | Status: DC | PRN

## 2014-09-11 MED ORDER — MORPHINE SULFATE 10 MG/ML IJ SOLN
2.0000 mg | INTRAMUSCULAR | Status: DC | PRN
Start: 2014-09-11 — End: 2014-09-11

## 2014-09-11 MED ORDER — FENTANYL CITRATE (PF) 100 MCG/2ML IJ SOLN
INTRAMUSCULAR | Status: DC | PRN
  Administered 2014-09-11: 50 ug via INTRAVENOUS
  Administered 2014-09-11 (×2): 100 ug via INTRAVENOUS
  Administered 2014-09-11 (×5): 50 ug via INTRAVENOUS

## 2014-09-11 MED ORDER — OXYCODONE HCL 5 MG PO TABS: 5.0000 mg | ORAL_TABLET | ORAL | Status: DC | PRN

## 2014-09-11 MED ORDER — MIDAZOLAM HCL 5 MG/5ML IJ SOLN
INTRAMUSCULAR | Status: DC | PRN
  Administered 2014-09-11: 2 mg via INTRAVENOUS

## 2014-09-11 MED ORDER — EPHEDRINE SULFATE 50 MG/ML IJ SOLN
INTRAMUSCULAR | Status: AC
  Filled 2014-09-11: qty 1

## 2014-09-11 MED ORDER — ACETAMINOPHEN 10 MG/ML IV SOLN
1000.0000 mg | Freq: Four times a day (QID) | INTRAVENOUS | Status: AC
  Administered 2014-09-12 (×4): 1000 mg via INTRAVENOUS
  Filled 2014-09-11 (×6): qty 100

## 2014-09-11 MED ORDER — SODIUM CHLORIDE 0.9 % IJ SOLN
INTRAMUSCULAR | Status: AC
  Filled 2014-09-11: qty 10

## 2014-09-11 MED ORDER — HYDROMORPHONE HCL 2 MG/ML IJ SOLN
INTRAMUSCULAR | Status: AC
  Filled 2014-09-11: qty 1

## 2014-09-11 MED ORDER — ROCURONIUM BROMIDE 100 MG/10ML IV SOLN
INTRAVENOUS | Status: DC | PRN
  Administered 2014-09-11 (×2): 20 mg via INTRAVENOUS
  Administered 2014-09-11 (×3): 10 mg via INTRAVENOUS
  Administered 2014-09-11: 40 mg via INTRAVENOUS
  Administered 2014-09-11: 10 mg via INTRAVENOUS

## 2014-09-11 MED ORDER — SODIUM CHLORIDE 0.9 % IV SOLN
1.5000 g | INTRAVENOUS | Status: AC
  Administered 2014-09-11 (×2): 1.5 g via INTRAVENOUS
  Filled 2014-09-11 (×2): qty 1.5

## 2014-09-11 MED ORDER — ONDANSETRON HCL 4 MG/2ML IJ SOLN: 4.0000 mg | Freq: Four times a day (QID) | INTRAMUSCULAR | Status: DC | PRN

## 2014-09-11 MED ORDER — LIDOCAINE HCL (PF) 2 % IJ SOLN
INTRAMUSCULAR | Status: DC | PRN
  Administered 2014-09-11: 20 mg via INTRADERMAL

## 2014-09-11 MED ORDER — NEOSTIGMINE METHYLSULFATE 10 MG/10ML IV SOLN
INTRAVENOUS | Status: DC | PRN
  Administered 2014-09-11: 4 mg via INTRAVENOUS

## 2014-09-11 MED ORDER — SODIUM CHLORIDE 0.9 % IV SOLN
3.0000 g | Freq: Four times a day (QID) | INTRAVENOUS | Status: DC
  Administered 2014-09-11 – 2014-09-14 (×10): 3 g via INTRAVENOUS
  Filled 2014-09-11 (×11): qty 3

## 2014-09-11 MED ORDER — PROPOFOL 10 MG/ML IV BOLUS
INTRAVENOUS | Status: AC
  Filled 2014-09-11: qty 20

## 2014-09-11 MED ORDER — BUPIVACAINE-EPINEPHRINE (PF) 0.25% -1:200000 IJ SOLN
INTRAMUSCULAR | Status: AC
  Filled 2014-09-11: qty 30

## 2014-09-11 MED ORDER — GLYCOPYRROLATE 0.2 MG/ML IJ SOLN
INTRAMUSCULAR | Status: DC | PRN
  Administered 2014-09-11: 0.6 mg via INTRAVENOUS

## 2014-09-11 MED ORDER — GLIPIZIDE 2.5 MG HALF TABLET
2.5000 mg | ORAL_TABLET | Freq: Every day | ORAL | Status: DC
  Filled 2014-09-11: qty 1

## 2014-09-11 MED ORDER — HEPARIN SODIUM (PORCINE) 5000 UNIT/ML IJ SOLN
5000.0000 [IU] | Freq: Three times a day (TID) | INTRAMUSCULAR | Status: DC
  Administered 2014-09-12 – 2014-09-15 (×11): 5000 [IU] via SUBCUTANEOUS
  Filled 2014-09-11 (×9): qty 1

## 2014-09-11 MED ORDER — ONDANSETRON HCL 4 MG PO TABS
4.0000 mg | ORAL_TABLET | Freq: Four times a day (QID) | ORAL | Status: DC | PRN
  Administered 2014-09-14 (×2): 4 mg via ORAL
  Filled 2014-09-11 (×2): qty 1

## 2014-09-11 MED ORDER — OXYCODONE HCL 5 MG PO TABS
5.0000 mg | ORAL_TABLET | ORAL | Status: DC | PRN
  Administered 2014-09-13: 5 mg via ORAL
  Filled 2014-09-11: qty 1

## 2014-09-11 MED ORDER — AMPICILLIN-SULBACTAM SODIUM 1.5 (1-0.5) G IJ SOLR: 1.5000 g | Freq: Four times a day (QID) | INTRAMUSCULAR | Status: DC

## 2014-09-11 MED ORDER — HYDROMORPHONE HCL 1 MG/ML IJ SOLN: 0.2500 mg | INTRAMUSCULAR | Status: DC | PRN

## 2014-09-11 MED ORDER — SENNA 8.6 MG PO TABS
1.0000 | ORAL_TABLET | Freq: Two times a day (BID) | ORAL | Status: DC
  Filled 2014-09-11 (×3): qty 1

## 2014-09-11 MED ORDER — BUPIVACAINE LIPOSOME 1.3 % IJ SUSP
20.0000 mL | Freq: Once | INTRAMUSCULAR | Status: AC
  Administered 2014-09-11: 20 mL
  Filled 2014-09-11: qty 20

## 2014-09-11 MED ORDER — LACTATED RINGERS IV SOLN
INTRAVENOUS | Status: DC | PRN
  Administered 2014-09-11 (×3): via INTRAVENOUS

## 2014-09-11 MED ORDER — ONDANSETRON HCL 4 MG/2ML IJ SOLN
4.0000 mg | Freq: Four times a day (QID) | INTRAMUSCULAR | Status: DC | PRN
  Administered 2014-09-12 (×2): 4 mg via INTRAVENOUS
  Filled 2014-09-11 (×2): qty 2

## 2014-09-11 MED ORDER — SODIUM CHLORIDE 0.9 % IR SOLN
Status: DC | PRN
  Administered 2014-09-11: 500 mL

## 2014-09-11 MED ORDER — LACTATED RINGERS IV SOLN
INTRAVENOUS | Status: DC
  Administered 2014-09-11: 22:00:00 via INTRAVENOUS

## 2014-09-11 MED ORDER — ONDANSETRON HCL 4 MG/2ML IJ SOLN
INTRAMUSCULAR | Status: AC
  Filled 2014-09-11: qty 2

## 2014-09-11 MED ORDER — LIDOCAINE HCL (CARDIAC) 20 MG/ML IV SOLN
INTRAVENOUS | Status: AC
  Filled 2014-09-11: qty 5

## 2014-09-11 SURGICAL SUPPLY — 64 items
APPLICATOR SURGIFLO ENDO (HEMOSTASIS) ×2 IMPLANT
APPLIER CLIP ROT 10 11.4 M/L (STAPLE)
BAG ZIPLOCK 12X15 (MISCELLANEOUS) ×2 IMPLANT
BLADE EXTENDED COATED 6.5IN (ELECTRODE) ×2 IMPLANT
BLADE SURG SZ10 CARB STEEL (BLADE) ×2 IMPLANT
CABLE HIGH FREQUENCY MONO STRZ (ELECTRODE) ×2 IMPLANT
CHLORAPREP W/TINT 26ML (MISCELLANEOUS) ×2 IMPLANT
CLIP APPLIE ROT 10 11.4 M/L (STAPLE) IMPLANT
CLIP LIGATING HEM O LOK PURPLE (MISCELLANEOUS) ×4 IMPLANT
CLIP LIGATING HEMO LOK XL GOLD (MISCELLANEOUS) IMPLANT
CLIP LIGATING HEMO O LOK GREEN (MISCELLANEOUS) ×2 IMPLANT
COVER SURGICAL LIGHT HANDLE (MISCELLANEOUS) ×2 IMPLANT
CUTTER FLEX LINEAR 45M (STAPLE) ×2 IMPLANT
DRAIN CHANNEL 10F 3/8 F FF (DRAIN) IMPLANT
DRAIN CHANNEL 19F RND (DRAIN) ×2 IMPLANT
DRAPE INCISE IOBAN 66X45 STRL (DRAPES) ×2 IMPLANT
DRAPE WARM FLUID 44X44 (DRAPE) ×2 IMPLANT
DRSG TEGADERM 2-3/8X2-3/4 SM (GAUZE/BANDAGES/DRESSINGS) ×2 IMPLANT
DRSG TEGADERM 4X4.75 (GAUZE/BANDAGES/DRESSINGS) ×2 IMPLANT
ELECT REM PT RETURN 9FT ADLT (ELECTROSURGICAL) ×2
ELECTRODE REM PT RTRN 9FT ADLT (ELECTROSURGICAL) ×1 IMPLANT
EVACUATOR SILICONE 100CC (DRAIN) ×6 IMPLANT
FLOSEAL 10ML (HEMOSTASIS) IMPLANT
GAUZE SPONGE 2X2 8PLY STRL LF (GAUZE/BANDAGES/DRESSINGS) ×1 IMPLANT
GLOVE BIOGEL M STRL SZ7.5 (GLOVE) ×2 IMPLANT
GOWN STRL REUS W/TWL LRG LVL3 (GOWN DISPOSABLE) ×6 IMPLANT
HEMOSTAT SURGICEL 4X8 (HEMOSTASIS) ×2 IMPLANT
KIT BASIN OR (CUSTOM PROCEDURE TRAY) ×2 IMPLANT
LIQUID BAND (GAUZE/BANDAGES/DRESSINGS) ×2 IMPLANT
MANIFOLD NEPTUNE II (INSTRUMENTS) ×2 IMPLANT
NEEDLE SPNL 22GX3.5 QUINCKE BK (NEEDLE) ×2 IMPLANT
PENCIL BUTTON HOLSTER BLD 10FT (ELECTRODE) ×2 IMPLANT
POSITIONER SURGICAL ARM (MISCELLANEOUS) ×4 IMPLANT
POUCH ENDO CATCH II 15MM (MISCELLANEOUS) ×2 IMPLANT
RELOAD 45 VASCULAR/THIN (ENDOMECHANICALS) ×6 IMPLANT
RELOAD STAPLE TA45 3.5 REG BLU (ENDOMECHANICALS) IMPLANT
RETRACTOR LAPSCP 12X46 CVD (ENDOMECHANICALS) IMPLANT
RTRCTR LAPSCP 12X46 CVD (ENDOMECHANICALS)
SCISSORS LAP 5X35 DISP (ENDOMECHANICALS) IMPLANT
SET IRRIG TUBING LAPAROSCOPIC (IRRIGATION / IRRIGATOR) ×2 IMPLANT
SHEARS HARMONIC 9CM CVD (BLADE) ×2 IMPLANT
SHEARS HARMONIC ACE PLUS 36CM (ENDOMECHANICALS) ×2 IMPLANT
SLEEVE XCEL OPT CAN 5 100 (ENDOMECHANICALS) ×4 IMPLANT
SPONGE DRAIN TRACH 4X4 STRL 2S (GAUZE/BANDAGES/DRESSINGS) ×2 IMPLANT
SPONGE GAUZE 2X2 STER 10/PKG (GAUZE/BANDAGES/DRESSINGS) ×1
SPONGE LAP 4X18 X RAY DECT (DISPOSABLE) ×2 IMPLANT
SPONGE SURGIFOAM ABS GEL 100 (HEMOSTASIS) IMPLANT
SUT ETHILON 3 0 PS 1 (SUTURE) ×2 IMPLANT
SUT MNCRL AB 4-0 PS2 18 (SUTURE) ×4 IMPLANT
SUT PDS AB 0 CT1 36 (SUTURE) ×6 IMPLANT
SUT VIC AB 0 CT1 36 (SUTURE) ×2 IMPLANT
SUT VIC AB 2-0 CT1 27 (SUTURE) ×1
SUT VIC AB 2-0 CT1 27XBRD (SUTURE) ×1 IMPLANT
SUT VICRYL 0 UR6 27IN ABS (SUTURE) ×8 IMPLANT
SYRINGE 60CC LL (MISCELLANEOUS) ×2 IMPLANT
TOWEL OR 17X26 10 PK STRL BLUE (TOWEL DISPOSABLE) ×2 IMPLANT
TRAY FOLEY CATH 14FRSI W/METER (CATHETERS) ×2 IMPLANT
TRAY FOLEY CATH 16FR SILVER (SET/KITS/TRAYS/PACK) IMPLANT
TRAY FOLEY W/METER SILVER 16FR (SET/KITS/TRAYS/PACK) IMPLANT
TRAY LAPAROSCOPIC (CUSTOM PROCEDURE TRAY) ×2 IMPLANT
TROCAR BLADELESS OPT 5 100 (ENDOMECHANICALS) ×2 IMPLANT
TROCAR XCEL 12X100 BLDLESS (ENDOMECHANICALS) ×4 IMPLANT
TUBE CONNECTING VINYL 14FR 30C (MISCELLANEOUS) ×4 IMPLANT
YANKAUER SUCT BULB TIP 10FT TU (MISCELLANEOUS) ×2 IMPLANT

## 2014-09-11 NOTE — Transfer of Care (Signed)
Immediate Anesthesia Transfer of Care Note  Patient: Catherine Ali  Procedure(s) Performed: Procedure(s): LEFT LAPAROSCOPIC NEPHRECTOMY (Left)  Patient Location: PACU  Anesthesia Type:General  Level of Consciousness: sedated  Airway & Oxygen Therapy: Patient Spontanous Breathing and Patient connected to face mask oxygen  Post-op Assessment: Report given to RN and Post -op Vital signs reviewed and stable  Post vital signs: Reviewed and stable  Last Vitals:  Filed Vitals:   09/11/14 0903  BP: 121/62  Pulse: 85  Temp: 36.7 C  Resp: 16    Complications: No apparent anesthesia complications

## 2014-09-11 NOTE — Discharge Instructions (Signed)

## 2014-09-11 NOTE — Anesthesia Procedure Notes (Signed)
Procedure Name: Intubation Date/Time: 09/11/2014 11:00 AM Performed by: Early OsmondEARGLE, Aryaan Persichetti E Pre-anesthesia Checklist: Patient identified, Emergency Drugs available, Suction available and Patient being monitored Patient Re-evaluated:Patient Re-evaluated prior to inductionOxygen Delivery Method: Circle System Utilized Preoxygenation: Pre-oxygenation with 100% oxygen Intubation Type: IV induction Ventilation: Mask ventilation without difficulty and Oral airway inserted - appropriate to patient size Laryngoscope Size: Mac and 3 Grade View: Grade III Tube type: Oral Tube size: 7.0 mm Number of attempts: 3 (First attempt by paramedic student, 2nd by CRNA) Airway Equipment and Method: Stylet and Oral airway Placement Confirmation: ETT inserted through vocal cords under direct vision,  positive ETCO2 and breath sounds checked- equal and bilateral Secured at: 21 cm Tube secured with: Tape Dental Injury: Teeth and Oropharynx as per pre-operative assessment  Difficulty Due To: Difficulty was unanticipated and Difficult Airway- due to anterior larynx Comments: Anterior with minimal view, easy to ventilate

## 2014-09-11 NOTE — Anesthesia Preprocedure Evaluation (Signed)
Anesthesia Evaluation  Patient identified by MRN, date of birth, ID band Patient awake    Reviewed: Allergy & Precautions, NPO status , Patient's Chart, lab work & pertinent test results  Airway Mallampati: II  TM Distance: >3 FB Neck ROM: Full    Dental no notable dental hx.    Pulmonary neg pulmonary ROS,  breath sounds clear to auscultation  Pulmonary exam normal       Cardiovascular negative cardio ROS Normal cardiovascular examRhythm:Regular Rate:Normal     Neuro/Psych Anxiety negative neurological ROS     GI/Hepatic negative GI ROS, Neg liver ROS,   Endo/Other  diabetes, Type 2, Insulin Dependent, Oral Hypoglycemic Agents  Renal/GU Renal disease  negative genitourinary   Musculoskeletal negative musculoskeletal ROS (+)   Abdominal   Peds negative pediatric ROS (+)  Hematology  (+) anemia ,   Anesthesia Other Findings   Reproductive/Obstetrics negative OB ROS                             Anesthesia Physical Anesthesia Plan  ASA: II  Anesthesia Plan: General   Post-op Pain Management:    Induction: Intravenous  Airway Management Planned: Oral ETT  Additional Equipment:   Intra-op Plan:   Post-operative Plan: Extubation in OR  Informed Consent: I have reviewed the patients History and Physical, chart, labs and discussed the procedure including the risks, benefits and alternatives for the proposed anesthesia with the patient or authorized representative who has indicated his/her understanding and acceptance.   Dental advisory given  Plan Discussed with: CRNA  Anesthesia Plan Comments:         Anesthesia Quick Evaluation

## 2014-09-11 NOTE — H&P (Signed)
Reason For Visit f/u for XGP   History of Present Illness 37F who is seen today in f/u from recent hospitalization for left sided XGP. She presented to the ED after undergoing CT for fluid collection in left lateral flank demonstrated a large left XGP with gas forming organism with associated large staghorn stone. Upon this admission she underwent placement of two drains, one within the subcutanous tissue and the second along the perinephric space. Her cultures have grown out Microaeorphilic Strep (strep milleri) She was also newly diangosed with diabetes, A1c 14.5 (avg BG 365). Remarkably she was relatively asymptomatic.    Patient's repeat CT scan shows smaller subcutaneous abscess in left flank and less fluid surrounding the kidney with less debris in collecting system. Her MAG3 demonstrates a 17% function of the left kidney. She has been on Augmentin over the last 10 days.     Intv: The patient denies any pain, fevers, or any new/additional problems. She is recently seen by her primary care provider who is increased her insulin based upon her recent elevated blood glucose levels. She continues on Augmentin and has been emptying her drains on a daily basis without difficulty. The drain output has been fairly minimal yet consistent.   Past Medical History Problems  1. History of diabetes mellitus (Z86.39) 2. History of renal failure (F62.130(Z87.448)  Surgical History Problems  1. History of Surgical Drainage Of Renal Abscess Percutaneous  Current Meds 1. Amoxicillin-Pot Clavulanate 500-125 MG Oral Tablet; TAKE 1 TABLET TWICE DAILY WITH  FOOD;  Therapy: 14Jun2016 to (Evaluate:24Jun2016)  Requested for: 14Jun2016; Last  Rx:14Jun2016 Ordered 2. Daily Multivitamin TABS;  Therapy: (Recorded:14Jun2016) to Recorded 3. GlipiZIDE 5 MG Oral Tablet;  Therapy: (Recorded:14Jun2016) to Recorded 4. Saline 0.9 % Solution; USE AS DIRECTED;  Therapy: 14Jun2016 to (Last Rx:14Jun2016)  Requested for:  14Jun2016 Ordered  Allergies Medication  1. No Known Drug Allergies  Family History Problems  1. Family history of lung cancer (Z80.1) : Brother, Aunt, [place]cle, Cousin 2. Family history of nephrolithiasis (Z84.1) : Father 3. Family history of stroke (Z82.3) : Father  Social History Problems  1. Alcohol use (Z78.9) 2. Caffeine use (F15.90)   3 3. Never a smoker 4. Number of children   2 sons 5. Occupation   Medical sales representativeMaterial handlier 6. Single  Review of Systems No chest pain, shortness of breath, nausea vomiting or diarrhea.   Vitals Vital Signs [Data Includes: Last 1 Day]  Recorded: 24Jun2016 02:45PM  Blood Pressure: 105 / 68 Temperature: 98.8 F Heart Rate: 91  Physical Exam Constitutional: Well nourished and well developed . No acute distress.  ENT:. The ears and nose are normal in appearance.  Neck: The appearance of the neck is normal and no neck mass is present.  Pulmonary: No respiratory distress and normal respiratory rhythm and effort.  Cardiovascular: Heart rate and rhythm are normal . No peripheral edema.  Abdomen: No hernias are palpable. The patient's fluctuant abscess has reduced in size significantly. The tenderness has largely improved. The drains are functioning without evidence of erythema at the insertion sites.  Lymphatics: The femoral and inguinal nodes are not enlarged or tender.  Skin: Normal skin turgor, no visible rash and no visible skin lesions.  Neuro/Psych:. Mood and affect are appropriate.    Results/Data Urine [Data Includes: Last 1 Day]   24Jun2016  COLOR YELLOW   APPEARANCE CLOUDY   SQUAMOUS EPITHELIAL/HPF NONE SEEN   WBC TNTC WBC/hpf  RBC NONE SEEN RBC/hpf  BACTERIA RARE   CRYSTALS NONE  SEEN   CASTS NONE SEEN    I reviewed the patient's MAG3 scan as well as her repeat CT scan.  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 UA With REFLEX; [Do  Not Release]; Status:Complete;   Done: 24Jun2016 02:27PM Xanthogranulomatous pyelonephritis  2. Renew: Amoxicillin-Pot Clavulanate 500-125 MG Oral Tablet; TAKE 1 TABLET TWICE  DAILY WITH FOOD  Discussion/Summary The patient has a XGP left kidney. This appears to have minimal function based on her MAG3 scan. The CT scan demonstrated control of the fluid collections. I discussed the next step in the management of this and recommended that the patient consider a nephrectomy. We discussed the pros and cons of nephrectomy versus percutaneous nephrolithotomy with kidney salvage. Based on her kidney differential function, I don't think going through the multi-staged PCNL followed by chronic infection and complications is worth the minimal kidney function that we would be preserving. Our plan is to start the nephrectomy laparoscopically. We would likely remove the drain that is around the kidney and irrigated this area with antibiotic solution prior to closing. We may or may not leave a drain. I would also consider exploring her abscess cavity in the subcutaneous space and washing out the packing. I discussed the surgery with the patient in great detail. I described for her the risks of damage to the surrounding structures including bowel, spleen, pancreas. I also discussed injury to major vessels requiring a blood transfusion. She also understands that there is a large risk for conversion to open nephrectomy. I then explained the patient's postoperative course, and expect she will be in the hospital for between 2 and 5 days following the operation. Having gone through the risks and benefits of the operation as well as the surgery and expected recovery and the rationale for this treatment decision the patient would like to proceed.

## 2014-09-11 NOTE — Op Note (Signed)
Preoperative diagnosis:  1. Xanthogranulomatous pyelonephritis   Postoperative diagnosis:  1. Same   Procedure: 1. Laparoscopic left radical nephrectomy  Surgeon: Crist FatBenjamin W. Amandalynn Pitz, MD Resident assistant: Dr. Loura PardonMatthew Lyons, M.D.  Anesthesia: General  Complications: None  Intraoperative findings: The hilum was difficult to dissect because of the adhesions and immobility of the kidney.  The inferior mesenteric artery was ligated because of intraoperative bleeding.  The renal artery and renal vein were taken without complication using a stapler.  Once the kidney was completely mobilized and dissected free a thorough inspection and evaluation of the pancreas and spleen demonstrated no evidence of bleeding or injury.  The splenic flexure of the colon was also thoroughly evaluated and I also obtained an intraoperative curbside consult from general surgery to ensure that the colon was okay.  I did leave a drain in the resection bed along the pancreas.  EBL: 250 mL's  Specimens: Left kidney and proximal ureter  Indication: Catherine Ali is a 59 y.o. patient with Left-sided xanthogranulomatous pyelonephritis.  After reviewing the management options for treatment, Catherine Ali elected to proceed with the above surgical procedure(s). We have discussed the potential benefits and risks of the procedure, side effects of the proposed treatment, the likelihood of the patient achieving the goals of the procedure, and any potential problems that might occur during the procedure or recuperation. Informed consent has been obtained.  Description of procedure:  A site was selected lateral to the umbilicus for placement of the camera port. This was placed using a standard open Hassan technique which allowed entry into the peritoneal cavity under direct vision and without difficulty. A 12 mm Hassan cannula was placed and a pneumoperitoneum established. The camera was then used to inspect the abdomen and there was no  evidence of any intra-abdominal injuries or other abnormalities. The remaining abdominal ports were then placed. One 5 mm trocar was placed subcostal margin in the left upper quadrant, the second 5 mm trocar was placed laterally to the camera port so as to triangulate the kidney. An assistant port was then placed in between the camera and the left lateral port. The assistant port was a 12 mm port.  The white line of Toldt was incised allowing the colon to be mobilized medially and the plane between the mesocolon and the anterior layer of Gerota's fascia to be developed and the kidney exposed. The ureter and gonadal vein were identified inferiorly and the ureter was lifted anteriorly off the psoas muscle. The gonadal vein was then dissected out inferior to the lower pole and 2 clips were placed both superiorly and inferiorly and then ligated. A second 5 mm port was placed in the left lower quadrant to help facilitate lifting up of the kidney. Dissection proceeded superiorly along the gonadal vein until the renal vein was identified. The dissection proceeded superiorly more medially than normal because of the encased throat his fascia and adhesions to the surrounding structures.  Ultimately, I was over top of the aorta and ligated the inferior mesenteric artery because of intraoperative bleeding.  Ultimately, I was able to proceed safely of the aorta and identified the renal hilum.  The renal hilum was then carefully isolated with a combination of blunt and sharp dissectiong allowing the renal arterial and venous structures to be separated and isolated.  The renal artery was isolated and ligated with a 45 mm Flex ETS stapler. The renal vein was then isolated and also ligated and divided with a 45 mm Flex ETS stapler.  Once the hilum had been ligated dissection ensued from the inferior pole of the kidney. The ureter was transected placing 2 clips on the stay side and one on the specimen side.  Our attention was then  turned to the upper pole which was dissected off of the spleen and the splenic attachments.  Gerota's fascia was intentionally entered superiorly To facilitate dissection of the spleen off of the Gerota's fascia which within this area was severely adherent and the tissue thickened and fibrotic.  The splenic flexure was frozen to the kidney which I was able to dissected off of the kidney leaving some Gerotas on the area.  The lateral attachments of the kidney were then freed. The remaining of the posterior aspect of the attachments was then ligated using a Harmonic Scalpel. Once the kidney was freed from its attachments it was shown to medially and the vascular hilum was inspected and noted to be sufficiently hemostatic. There was slight bit of oozing from the adrenal gland which was cauterized and then Surgicel placed over top. The wound bed and abscess cavity was then irrigated copiously with triple antibiotic solution. Insufflation was then turned down to 8 mm mercury and the kidney bed was noted to be hemostatic.  40cc of Exparel was then injected into the left anterior axillary line b/w the iliac crest and the twelfth rib under laparoscopic guidance. The layer between the tranversus abdominus and the internal oblique was targeted.  The kidney/ureter specimen was then placed into a 15 mm Endocatch II retrieval bag, this was passed through the port site of the assistant port and left lower quadrant. A 19 [place] Blake drain was then placed through a separate stab incision in the left lower quadrant and placed along the renal bed beside the pancreas.  The trochars were then removed under visual guidance to ensure no ongoing port site bleeding was occurring. The extraction incision was extended from the 12 mm left lower quadrant port. The external oblique and internal oblique muscles were spread as best as possible with as little muscle fibers ligated as possible in order to safely extracted the specimen. The  internal oblique flash of was then closed with 2-0 Vicryl in an interrupted figure-of-eight fashion. The external oblique fascia was closed with a 0 looped PDS. The camera port was then closed with 2-0 Vicryl the level of the fascia. All incisions were injected with Exparel and reapproximated at the skin with 4-0 monocryl sutures. Dermabond was applied to the skin. The patient tolerated the procedure well and without complications and was transferred to the recovery unit in satisfactory condition.

## 2014-09-12 ENCOUNTER — Encounter (HOSPITAL_COMMUNITY): Payer: Self-pay | Admitting: Urology

## 2014-09-12 LAB — GLUCOSE, CAPILLARY
GLUCOSE-CAPILLARY: 132 mg/dL — AB (ref 65–99)
Glucose-Capillary: 171 mg/dL — ABNORMAL HIGH (ref 65–99)
Glucose-Capillary: 150 mg/dL — ABNORMAL HIGH (ref 65–99)
Glucose-Capillary: 146 mg/dL — ABNORMAL HIGH (ref 65–99)
Glucose-Capillary: 118 mg/dL — ABNORMAL HIGH (ref 65–99)
Glucose-Capillary: 120 mg/dL — ABNORMAL HIGH (ref 65–99)

## 2014-09-12 LAB — BASIC METABOLIC PANEL
ANION GAP: 8 (ref 5–15)
BUN: 13 mg/dL (ref 6–20)
CO2: 26 mmol/L (ref 22–32)
Calcium: 8 mg/dL — ABNORMAL LOW (ref 8.9–10.3)
Chloride: 102 mmol/L (ref 101–111)
Creatinine, Ser: 0.79 mg/dL (ref 0.44–1.00)
GLUCOSE: 162 mg/dL — AB (ref 65–99)
Potassium: 4.2 mmol/L (ref 3.5–5.1)
Sodium: 136 mmol/L (ref 135–145)

## 2014-09-12 LAB — CBC
HCT: 28.2 % — ABNORMAL LOW (ref 36.0–46.0)
Hemoglobin: 8.9 g/dL — ABNORMAL LOW (ref 12.0–15.0)
MCH: 24.5 pg — AB (ref 26.0–34.0)
MCHC: 31.6 g/dL (ref 30.0–36.0)
MCV: 77.7 fL — ABNORMAL LOW (ref 78.0–100.0)
PLATELETS: 331 10*3/uL (ref 150–400)
RBC: 3.63 MIL/uL — AB (ref 3.87–5.11)
RDW: 14.8 % (ref 11.5–15.5)
WBC: 9.7 10*3/uL (ref 4.0–10.5)

## 2014-09-12 MED ORDER — PANTOPRAZOLE SODIUM 40 MG IV SOLR
40.0000 mg | INTRAVENOUS | Status: DC
  Administered 2014-09-12 – 2014-09-14 (×3): 40 mg via INTRAVENOUS
  Filled 2014-09-12 (×3): qty 40

## 2014-09-12 MED ORDER — DEXTROSE-NACL 5-0.45 % IV SOLN: INTRAVENOUS | Status: DC

## 2014-09-12 MED ORDER — SODIUM CHLORIDE 0.45 % IV SOLN
INTRAVENOUS | Status: DC
  Administered 2014-09-12 – 2014-09-13 (×3): via INTRAVENOUS

## 2014-09-12 NOTE — Discharge Summary (Signed)
Urology Discharge Summary  Admit date: 09/11/2014  Discharge date and time: 09/15/14  Discharge to:  Home  Discharge Service: Urology  Discharge Attending Physician: Crist FatBenjamin W Herrick, MD  Discharge  Diagnoses: LEFT xanthogranulomatous pyelonephritis  Secondary Diagnosis: Active Problems:   XGP (xanthogranulomatous pyelonephritis)   OR Procedures: Procedure(s): LEFT LAPAROSCOPIC NEPHRECTOMY 09/11/2014   Ancillary Procedures: None  Discharge Day Services: The patient was seen and examined by the Urology team both in the morning and immediately prior to discharge.  Vital signs and laboratory values were stable and within normal limits.  The physical exam was benign and unchanged and all surgical wounds were examined.  Discharge instructions were explained and all questions answered.  Subjective  NAEON. Pain controlled on PO meds.  Objective Patient Vitals for the past 8 hrs:  BP Temp Temp src Pulse Resp SpO2  09/12/14 1003 (!) 104/45 mmHg 98 F (36.7 C) Oral 77 18 100 %  09/12/14 0637 (!) 114/51 mmHg 97.6 F (36.4 C) Oral 83 20 100 %   Total I/O In: 240 [P.O.:240] Out: 733 [Urine:700; Drains:33]   Hospital Course:  The patient underwent LEFT laparoscopic radical nephrectomy on 09/11/2014.  The patient tolerated the procedure well, was extubated in the OR, and afterwards was taken to the PACU for routine post-surgical care. When stable the patient was transferred to the floor.   The patient did well postoperatively. There was some concern intra-operatively for injury to the pancreas so her wound bed drain was sent for amylase and lipase on POD 2 which returned negative for pancreatic leak. Her Posterior most IR drain was removed POD 4. Her more superior IR drain was not removed.  The patient's diet was slowly advanced and at the time of discharge was tolerating a regular diet.  The patient was discharged home on POD 4, at which point was tolerating a regular solid diet, was able  to void spontaneously, have adequate pain control with P.O. pain medication, and could ambulate without difficulty. The patient will follow up with us for post op check.   Condition at Discharge: Good Discharge Medications:   No current facility-administered medications on file prior to encounter.   Current Outpatient Prescriptions on File Prior to Encounter  Medication Sig Dispense Refill  . glipiZIDE (GLUCOTROL) 5 MG tablet Take 0.5 tablets (2.5 mg total) by mouth daily before breakfast. 30 tablet 0  . Multiple Vitamin (MULTIVITAMIN) tablet Take 1 tablet by mouth daily.      Pending Test Results:  Pathology report  Discharge Instructions: Patient was instructed to ambulate but to refrain from lifting > 10 lbs.  She will call if fever > 101 or persistent nausea/vomiting.

## 2014-09-12 NOTE — Anesthesia Postprocedure Evaluation (Signed)
  Anesthesia Post-op Note  Patient: Catherine Ali  Procedure(s) Performed: Procedure(s) (LRB): LEFT LAPAROSCOPIC NEPHRECTOMY (Left)  Patient Location: PACU  Anesthesia Type: General  Level of Consciousness: awake and alert   Airway and Oxygen Therapy: Patient Spontanous Breathing  Post-op Pain: mild  Post-op Assessment: Post-op Vital signs reviewed, Patient's Cardiovascular Status Stable, Respiratory Function Stable, Patent Airway and No signs of Nausea or vomiting. Exhibited low urine output in pacu. Crystalloid bolus of 500 cc IV times two. No output. Bladder scan shows 240 cc. Irrigation of foley allowed output of 350 cc. Dr. Marlou PorchHerrick aware.  Last Vitals:  Filed Vitals:   09/12/14 0219  BP: 130/51  Pulse: 88  Temp: 36.8 C  Resp: 20    Post-op Vital Signs: stable   Complications: No apparent anesthesia complications

## 2014-09-12 NOTE — Care Management Note (Signed)
Case Management Note  Patient Details  Name: Charmaine Downshyllis Griggs MRN: [id number] Date of Birth: 1956-02-02  Subjective/Objective:     F/U XGP               Action/Plan: from home   Expected Discharge Date:                  Expected Discharge Plan:  Home/Self Care  In-House Referral:     Discharge planning Services  CM Consult  Post Acute Care Choice:    Choice offered to:     DME Arranged:    DME Agency:     HH Arranged:    HH Agency:     Status of Service:  In process, will continue to follow  Medicare Important Message Given:    Date Medicare IM Given:    Medicare IM give by:    Date Additional Medicare IM Given:    Additional Medicare Important Message give by:     If discussed at Long Length of Stay Meetings, dates discussed:    Additional CommentsGeni Bers:  Maloni Musleh, RN 09/12/2014, 4:09 PM

## 2014-09-12 NOTE — Progress Notes (Signed)
. 1 Day Post-Op Subjective: The patient is doing well.  No nausea or vomiting. Pain is adequately controlled with the exception of some low back pain that has resolved. Also complaining of some reflux symptoms but no cardiac chest pain.   Objective: Vital signs in last 24 hours: Temp:  [97 F (36.1 C)-98.2 F (36.8 C)] 97.6 F (36.4 C) (06/29 1610) Pulse Rate:  [80-90] 83 (06/29 0637) Resp:  [12-21] 20 (06/29 0637) BP: (114-147)/(51-75) 114/51 mmHg (06/29 0637) SpO2:  [98 %-100 %] 100 % (06/29 9604) Arterial Line BP: (91-162)/(67-75) 156/69 mmHg (06/28 2045) Weight:  [68.04 kg (150 lb)] 68.04 kg (150 lb) (06/28 0903)  Intake/Output from previous day: 06/28 0701 - 06/29 0700 In: 4250 [I.V.:3350; IV Piggyback:900] Out: 3005 [Urine:1900; Drains:855; Blood:250] Intake/Output this shift:    Physical Exam:  General: Alert and oriented. CV: RRR Lungs: Clear bilaterally. GI: Soft, Nondistended. Wound bed JP serosang, decreasing output, Flank drains with minimal serous output Incisions: Clean and dry. Urine: Clear Extremities: Nontender, no erythema, no edema.  Lab Results:  Recent Labs  09/11/14 1810 09/12/14 0501  HGB 9.6* 8.9*  HCT 31.0* 28.2*          Recent Labs  09/07/14 1350 09/11/14 1810 09/12/14 0501  CREATININE 0.80 0.80 0.79           Results for orders placed or performed during the hospital encounter of 09/11/14 (from the past 24 hour(s))  Glucose, capillary     Status: None   Collection Time: 09/11/14  8:52 AM  Result Value Ref Range   Glucose-Capillary 92 65 - 99 mg/dL   Comment 1 Document in Chart   Glucose, capillary     Status: Abnormal   Collection Time: 09/11/14  5:39 PM  Result Value Ref Range   Glucose-Capillary 199 (H) 65 - 99 mg/dL  CBC     Status: Abnormal   Collection Time: 09/11/14  6:10 PM  Result Value Ref Range   WBC 11.1 (H) 4.0 - 10.5 K/uL   RBC 3.97 3.87 - 5.11 MIL/uL   Hemoglobin 9.6 (L) 12.0 - 15.0 g/dL   HCT 54.0 (L) 98.1  - 46.0 %   MCV 78.1 78.0 - 100.0 fL   MCH 24.2 (L) 26.0 - 34.0 pg   MCHC 31.0 30.0 - 36.0 g/dL   RDW 19.1 47.8 - 29.5 %   Platelets 395 150 - 400 K/uL  Basic metabolic panel     Status: Abnormal   Collection Time: 09/11/14  6:10 PM  Result Value Ref Range   Sodium 136 135 - 145 mmol/L   Potassium 4.8 3.5 - 5.1 mmol/L   Chloride 104 101 - 111 mmol/L   CO2 24 22 - 32 mmol/L   Glucose, Bld 212 (H) 65 - 99 mg/dL   BUN 15 6 - 20 mg/dL   Creatinine, Ser 6.21 0.44 - 1.00 mg/dL   Calcium 8.2 (L) 8.9 - 10.3 mg/dL   GFR calc non Af Amer >60 >60 mL/min   GFR calc Af Amer >60 >60 mL/min   Anion gap 8 5 - 15  Magnesium     Status: None   Collection Time: 09/11/14  6:10 PM  Result Value Ref Range   Magnesium 1.8 1.7 - 2.4 mg/dL  Phosphorus     Status: Abnormal   Collection Time: 09/11/14  6:10 PM  Result Value Ref Range   Phosphorus 5.0 (H) 2.5 - 4.6 mg/dL  Glucose, capillary     Status: Abnormal  Collection Time: 09/12/14 12:21 AM  Result Value Ref Range   Glucose-Capillary 171 (H) 65 - 99 mg/dL   Comment 1 Notify RN    Comment 2 Document in Chart   Glucose, capillary     Status: Abnormal   Collection Time: 09/12/14  3:47 AM  Result Value Ref Range   Glucose-Capillary 150 (H) 65 - 99 mg/dL   Comment 1 Notify RN    Comment 2 Document in Chart   CBC     Status: Abnormal   Collection Time: 09/12/14  5:01 AM  Result Value Ref Range   WBC 9.7 4.0 - 10.5 K/uL   RBC 3.63 (L) 3.87 - 5.11 MIL/uL   Hemoglobin 8.9 (L) 12.0 - 15.0 g/dL   HCT 09.828.2 (L) 11.936.0 - 14.746.0 %   MCV 77.7 (L) 78.0 - 100.0 fL   MCH 24.5 (L) 26.0 - 34.0 pg   MCHC 31.6 30.0 - 36.0 g/dL   RDW 82.914.8 56.211.5 - 13.015.5 %   Platelets 331 150 - 400 K/uL  Basic metabolic panel     Status: Abnormal   Collection Time: 09/12/14  5:01 AM  Result Value Ref Range   Sodium 136 135 - 145 mmol/L   Potassium 4.2 3.5 - 5.1 mmol/L   Chloride 102 101 - 111 mmol/L   CO2 26 22 - 32 mmol/L   Glucose, Bld 162 (H) 65 - 99 mg/dL   BUN 13 6 - 20  mg/dL   Creatinine, Ser 8.650.79 0.44 - 1.00 mg/dL   Calcium 8.0 (L) 8.9 - 10.3 mg/dL   GFR calc non Af Amer >60 >60 mL/min   GFR calc Af Amer >60 >60 mL/min   Anion gap 8 5 - 15    Assessment/Plan: POD# 1 s/p laparoscopic nephrectomy. IR drains minimal, wound bed JP with decreasing output.  1) Ambulate, Incentive spirometry 2) Advance diet as tolerated 3) remove posterior most IR drain 4) send wound bed JP for amylase/lipase tomorrow AM once tolerating diet 5) add pantoprazole for reflux discomfort 6) Transition to oral pain medication 7) MIVF at 75 will medlock later this PM if keeping up with fluids 8) D/C urethral catheter    LOS: 1 day   Catherine Ali 09/12/2014, 7:54 AM

## 2014-09-12 NOTE — Progress Notes (Signed)
Patient's BP continues to run low. Dr. Nunzio CoryLyons notified. IVF increased to 100. Will continue to monitor.

## 2014-09-13 LAB — CBC
HEMATOCRIT: 28.6 % — AB (ref 36.0–46.0)
HEMOGLOBIN: 9 g/dL — AB (ref 12.0–15.0)
MCH: 24.3 pg — AB (ref 26.0–34.0)
MCHC: 31.5 g/dL (ref 30.0–36.0)
MCV: 77.3 fL — AB (ref 78.0–100.0)
Platelets: 332 10*3/uL (ref 150–400)
RBC: 3.7 MIL/uL — AB (ref 3.87–5.11)
RDW: 15 % (ref 11.5–15.5)
WBC: 15.2 10*3/uL — ABNORMAL HIGH (ref 4.0–10.5)

## 2014-09-13 LAB — BASIC METABOLIC PANEL
Anion gap: 8 (ref 5–15)
BUN: 10 mg/dL (ref 6–20)
CALCIUM: 7.9 mg/dL — AB (ref 8.9–10.3)
CHLORIDE: 99 mmol/L — AB (ref 101–111)
CO2: 25 mmol/L (ref 22–32)
CREATININE: 0.66 mg/dL (ref 0.44–1.00)
GFR calc Af Amer: >60 mL/min (ref >60)
Glucose, Bld: 174 mg/dL — ABNORMAL HIGH (ref 65–99)
Potassium: 3.9 mmol/L (ref 3.5–5.1)
Sodium: 132 mmol/L — ABNORMAL LOW (ref 135–145)

## 2014-09-13 LAB — AMYLASE, PERITONEAL FLUID: Amylase, peritoneal fluid: 12 U/L

## 2014-09-13 LAB — GLUCOSE, CAPILLARY
GLUCOSE-CAPILLARY: 154 mg/dL — AB (ref 65–99)
Glucose-Capillary: 107 mg/dL — ABNORMAL HIGH (ref 65–99)
Glucose-Capillary: 117 mg/dL — ABNORMAL HIGH (ref 65–99)
Glucose-Capillary: 140 mg/dL — ABNORMAL HIGH (ref 65–99)
Glucose-Capillary: 149 mg/dL — ABNORMAL HIGH (ref 65–99)
Glucose-Capillary: 163 mg/dL — ABNORMAL HIGH (ref 65–99)

## 2014-09-13 MED ORDER — SENNA 8.6 MG PO TABS: 2.0000 | ORAL_TABLET | Freq: Every day | ORAL | Status: DC

## 2014-09-13 MED ORDER — ACETAMINOPHEN 10 MG/ML IV SOLN
1000.0000 mg | Freq: Four times a day (QID) | INTRAVENOUS | Status: AC
Start: 2014-09-13 — End: 2014-09-13
  Administered 2014-09-13 (×4): 1000 mg via INTRAVENOUS
  Filled 2014-09-13 (×7): qty 100

## 2014-09-13 MED ORDER — OXYCODONE-ACETAMINOPHEN 5-325 MG PO TABS: 1.0000 | ORAL_TABLET | ORAL | Status: DC | PRN

## 2014-09-13 MED ORDER — DOCUSATE SODIUM 100 MG PO CAPS: 100.0000 mg | ORAL_CAPSULE | Freq: Two times a day (BID) | ORAL | Status: DC

## 2014-09-13 MED ORDER — AMOXICILLIN-POT CLAVULANATE 500-125 MG PO TABS: 1.0000 | ORAL_TABLET | Freq: Two times a day (BID) | ORAL | Status: DC

## 2014-09-13 NOTE — Progress Notes (Signed)
. 2 Days Post-Op Subjective: C/o mild nausea when trying to eat, limited PO intake limited ambulation. States abdomen feels warm, pain controlled on PO oxycodone. No subjective fevers or chills. T max 37.6.  Objective: Vital signs in last 24 hours: Temp:  [97.7 F (36.5 C)-99.7 F (37.6 C)] 99.7 F (37.6 C) (06/30 0422) Pulse Rate:  [77-108] 108 (06/30 0422) Resp:  [18-20] 20 (06/30 0422) BP: (104-138)/(44-58) 138/50 mmHg (06/30 0422) SpO2:  [93 %-100 %] 93 % (06/30 0422)  Intake/Output from previous day: 06/29 0701 - 06/30 0700 In: 1096.3 [P.O.:280; I.V.:416.3; IV Piggyback:400] Out: 2715 [Urine:2500; Drains:215] Intake/Output this shift:    Physical Exam:  General: Alert and oriented. CV: RRR Lungs: Clear bilaterally. GI: Soft, Nondistended. Wound bed JP serosang, decreasing output, Flank drain with minimal serous output Incisions: Clean and dry. Urine: Clear Extremities: Nontender, no erythema, no edema.  Lab Results:  Recent Labs  09/11/14 1810 09/12/14 0501 09/13/14 0355  HGB 9.6* 8.9* 9.0*  HCT 31.0* 28.2* 28.6*           Recent Labs  09/11/14 1810 09/12/14 0501 09/13/14 0355  CREATININE 0.80 0.79 0.66           Results for orders placed or performed during the hospital encounter of 09/11/14 (from the past 24 hour(s))  Glucose, capillary     Status: Abnormal   Collection Time: 09/12/14  7:42 AM  Result Value Ref Range   Glucose-Capillary 146 (H) 65 - 99 mg/dL  Glucose, capillary     Status: Abnormal   Collection Time: 09/12/14 11:55 AM  Result Value Ref Range   Glucose-Capillary 132 (H) 65 - 99 mg/dL  Glucose, capillary     Status: Abnormal   Collection Time: 09/12/14  4:32 PM  Result Value Ref Range   Glucose-Capillary 118 (H) 65 - 99 mg/dL  Glucose, capillary     Status: Abnormal   Collection Time: 09/12/14  8:15 PM  Result Value Ref Range   Glucose-Capillary 120 (H) 65 - 99 mg/dL  Glucose, capillary     Status: Abnormal   Collection  Time: 09/12/14 11:59 PM  Result Value Ref Range   Glucose-Capillary 149 (H) 65 - 99 mg/dL  CBC     Status: Abnormal   Collection Time: 09/13/14  3:55 AM  Result Value Ref Range   WBC 15.2 (H) 4.0 - 10.5 K/uL   RBC 3.70 (L) 3.87 - 5.11 MIL/uL   Hemoglobin 9.0 (L) 12.0 - 15.0 g/dL   HCT 16.128.6 (L) 09.636.0 - 04.546.0 %   MCV 77.3 (L) 78.0 - 100.0 fL   MCH 24.3 (L) 26.0 - 34.0 pg   MCHC 31.5 30.0 - 36.0 g/dL   RDW 40.915.0 81.111.5 - 91.415.5 %   Platelets 332 150 - 400 K/uL  Basic metabolic panel     Status: Abnormal   Collection Time: 09/13/14  3:55 AM  Result Value Ref Range   Sodium 132 (L) 135 - 145 mmol/L   Potassium 3.9 3.5 - 5.1 mmol/L   Chloride 99 (L) 101 - 111 mmol/L   CO2 25 22 - 32 mmol/L   Glucose, Bld 174 (H) 65 - 99 mg/dL   BUN 10 6 - 20 mg/dL   Creatinine, Ser 7.820.66 0.44 - 1.00 mg/dL   Calcium 7.9 (L) 8.9 - 10.3 mg/dL   GFR calc non Af Amer >60 >60 mL/min   GFR calc Af Amer >60 >60 mL/min   Anion gap 8 5 - 15  Glucose, capillary  Status: Abnormal   Collection Time: 09/13/14  4:20 AM  Result Value Ref Range   Glucose-Capillary 154 (H) 65 - 99 mg/dL    Assessment/Plan: POD# 2 s/p laparoscopic nephrectomy. Leukocytosis to 15k, with mild tachycardia, no fever. IR drain minimal, wound bed JP with decreasing output. Will send fluid for amylase/lipase this AM. Monitor for further symptoms of infection.  1) Ambulate, Incentive spirometry 2) Send wound bed JP for amylase/lipase to eval for pancreatic leak 3) Continue IVF 4) Renew IV tylenol 5) Continue IV unasyn 6) Monitor for fever or worsening symptoms of infection    LOS: 2 days   Catherine Ali 09/13/2014, 7:03 AM

## 2014-09-13 NOTE — Clinical Documentation Improvement (Signed)
Abnormal Lab and/or Testing Results: Sodium: 136, 136, 132.  What, if any, clinical significance can be associated with these values?  Thank you, Alesia RichardsAnne Clyda Smyth, RN CDS [facility] Information Management [email or web address] [phone]

## 2014-09-14 ENCOUNTER — Other Ambulatory Visit: Payer: Self-pay | Admitting: Radiology

## 2014-09-14 DIAGNOSIS — N118 Other chronic tubulo-interstitial nephritis: Secondary | ICD-10-CM

## 2014-09-14 LAB — BASIC METABOLIC PANEL
ANION GAP: 9 (ref 5–15)
BUN: 10 mg/dL (ref 6–20)
CHLORIDE: 99 mmol/L — AB (ref 101–111)
CO2: 25 mmol/L (ref 22–32)
CREATININE: 0.76 mg/dL (ref 0.44–1.00)
Calcium: 7.9 mg/dL — ABNORMAL LOW (ref 8.9–10.3)
GFR calc Af Amer: >60 mL/min (ref >60)
GFR calc non Af Amer: >60 mL/min (ref >60)
Glucose, Bld: 100 mg/dL — ABNORMAL HIGH (ref 65–99)
Potassium: 3.3 mmol/L — ABNORMAL LOW (ref 3.5–5.1)
SODIUM: 133 mmol/L — AB (ref 135–145)

## 2014-09-14 LAB — CBC
HCT: 26.5 % — ABNORMAL LOW (ref 36.0–46.0)
Hemoglobin: 8.5 g/dL — ABNORMAL LOW (ref 12.0–15.0)
MCH: 24.6 pg — AB (ref 26.0–34.0)
MCHC: 32.1 g/dL (ref 30.0–36.0)
MCV: 76.8 fL — ABNORMAL LOW (ref 78.0–100.0)
Platelets: 302 10*3/uL (ref 150–400)
RBC: 3.45 MIL/uL — ABNORMAL LOW (ref 3.87–5.11)
RDW: 15.2 % (ref 11.5–15.5)
WBC: 13.4 10*3/uL — ABNORMAL HIGH (ref 4.0–10.5)

## 2014-09-14 LAB — GLUCOSE, CAPILLARY
GLUCOSE-CAPILLARY: 102 mg/dL — AB (ref 65–99)
GLUCOSE-CAPILLARY: 137 mg/dL — AB (ref 65–99)
GLUCOSE-CAPILLARY: 80 mg/dL (ref 65–99)
GLUCOSE-CAPILLARY: 93 mg/dL (ref 65–99)
GLUCOSE-CAPILLARY: 95 mg/dL (ref 65–99)
Glucose-Capillary: 123 mg/dL — ABNORMAL HIGH (ref 65–99)
Glucose-Capillary: 131 mg/dL — ABNORMAL HIGH (ref 65–99)

## 2014-09-14 MED ORDER — OXYCODONE-ACETAMINOPHEN 5-325 MG PO TABS: 1.0000 | ORAL_TABLET | ORAL | Status: DC | PRN

## 2014-09-14 MED ORDER — AMOXICILLIN-POT CLAVULANATE 875-125 MG PO TABS
1.0000 | ORAL_TABLET | Freq: Two times a day (BID) | ORAL | Status: DC
  Administered 2014-09-14 – 2014-09-15 (×3): 1 via ORAL
  Filled 2014-09-14 (×3): qty 1

## 2014-09-14 MED ORDER — POTASSIUM CHLORIDE 20 MEQ/15ML (10%) PO SOLN
40.0000 meq | Freq: Once | ORAL | Status: AC
Start: 2014-09-14 — End: 2014-09-14
  Administered 2014-09-14: 40 meq via ORAL
  Filled 2014-09-14: qty 30

## 2014-09-14 MED ORDER — AMOXICILLIN-POT CLAVULANATE 500-125 MG PO TABS: 1.0000 | ORAL_TABLET | Freq: Two times a day (BID) | ORAL | Status: AC

## 2014-09-14 MED ORDER — DOCUSATE SODIUM 100 MG PO CAPS: 100.0000 mg | ORAL_CAPSULE | Freq: Two times a day (BID) | ORAL | Status: DC

## 2014-09-14 MED ORDER — POTASSIUM CHLORIDE 20 MEQ PO PACK
40.0000 meq | PACK | Freq: Once | ORAL | Status: DC
  Filled 2014-09-14: qty 2

## 2014-09-14 NOTE — Progress Notes (Signed)
Doing better this PM, tolerated creamed mashed potatoes. Nausea improved.  Filed Vitals:   09/14/14 1359  BP: 134/62  Pulse: 99  Temp: 98.4 F (36.9 C)  Resp: 20   NAD Abdomen is soft serosanginous drainage from JP drain Purulent drainage from sub-Q drain Ext symmetric  Imp/Plan: Doing much better this PM.  Recheck Amylase in drain in AM, d/c prior to discharge.  Sub-q drain stays at discharge - sinogram scheduled with IR on Tuesday. Home with Augmentin. F/u scheduled for 7/11.

## 2014-09-14 NOTE — Progress Notes (Signed)
Pt unable to tolerate food intake, threw up after eating a few bites of lunch.  Zofran given at 1030, will give again when due.

## 2014-09-14 NOTE — Progress Notes (Addendum)
. 3 Days Post-Op Subjective: NAEON. Ambulating. Tolerating small amount of PO. Passing flatus. Minimal drain output.  Objective: Vital signs in last 24 hours: Temp:  [97.7 F (36.5 C)-99.2 F (37.3 C)] 98.2 F (36.8 C) (07/01 0550) Pulse Rate:  [90-101] 91 (07/01 0550) Resp:  [12-20] 18 (07/01 0550) BP: (103-123)/(45-57) 122/57 mmHg (07/01 0550) SpO2:  [92 %-97 %] 94 % (07/01 0550)  Intake/Output from previous day: 06/30 0701 - 07/01 0700 In: 1660 [P.O.:60; IV Piggyback:800] Out: 2336 [Urine:2250; Drains:86] Intake/Output this shift:    Physical Exam:  General: Alert and oriented. CV: RRR Lungs: Clear bilaterally. GI: Soft, Nondistended. Wound bed JP serosang, decreasing output, Flank drain with minimal serous output Incisions: Clean and dry. Urine: Clear Extremities: Nontender, no erythema, no edema.  Lab Results:  Recent Labs  09/12/14 0501 09/13/14 0355 09/14/14 0540  HGB 8.9* 9.0* 8.5*  HCT 28.2* 28.6* 26.5*           Recent Labs  09/12/14 0501 09/13/14 0355 09/14/14 0540  CREATININE 0.79 0.66 0.76           Results for orders placed or performed during the hospital encounter of 09/11/14 (from the past 24 hour(s))  Glucose, capillary     Status: Abnormal   Collection Time: 09/13/14 12:04 PM  Result Value Ref Range   Glucose-Capillary 163 (H) 65 - 99 mg/dL  Glucose, capillary     Status: Abnormal   Collection Time: 09/13/14  4:23 PM  Result Value Ref Range   Glucose-Capillary 107 (H) 65 - 99 mg/dL  Glucose, capillary     Status: Abnormal   Collection Time: 09/13/14  9:56 PM  Result Value Ref Range   Glucose-Capillary 117 (H) 65 - 99 mg/dL  Glucose, capillary     Status: Abnormal   Collection Time: 09/14/14 12:10 AM  Result Value Ref Range   Glucose-Capillary 39 (LL) 65 - 99 mg/dL   Comment 1 Notify RN   Glucose, capillary     Status: None   Collection Time: 09/14/14 12:14 AM  Result Value Ref Range   Glucose-Capillary 95 65 - 99 mg/dL   Glucose, capillary     Status: None   Collection Time: 09/14/14  3:57 AM  Result Value Ref Range   Glucose-Capillary 93 65 - 99 mg/dL  CBC     Status: Abnormal   Collection Time: 09/14/14  5:40 AM  Result Value Ref Range   WBC 13.4 (H) 4.0 - 10.5 K/uL   RBC 3.45 (L) 3.87 - 5.11 MIL/uL   Hemoglobin 8.5 (L) 12.0 - 15.0 g/dL   HCT 16.1 (L) 09.6 - 04.5 %   MCV 76.8 (L) 78.0 - 100.0 fL   MCH 24.6 (L) 26.0 - 34.0 pg   MCHC 32.1 30.0 - 36.0 g/dL   RDW 40.9 81.1 - 91.4 %   Platelets 302 150 - 400 K/uL  Basic metabolic panel     Status: Abnormal   Collection Time: 09/14/14  5:40 AM  Result Value Ref Range   Sodium 133 (L) 135 - 145 mmol/L   Potassium 3.3 (L) 3.5 - 5.1 mmol/L   Chloride 99 (L) 101 - 111 mmol/L   CO2 25 22 - 32 mmol/L   Glucose, Bld 100 (H) 65 - 99 mg/dL   BUN 10 6 - 20 mg/dL   Creatinine, Ser 7.82 0.44 - 1.00 mg/dL   Calcium 7.9 (L) 8.9 - 10.3 mg/dL   GFR calc non Af Amer >60 >60 mL/min  GFR calc Af Amer >60 >60 mL/min   Anion gap 9 5 - 15  Glucose, capillary     Status: None   Collection Time: 09/14/14  7:31 AM  Result Value Ref Range   Glucose-Capillary 80 65 - 99 mg/dL    Assessment/Plan: POD# 3 s/p laparoscopic nephrectomy. Improving IR drain minimal, wound bed JP with decreasing output. Surgical drain amylase negative. Overall improving.  1) Ambulate, Incentive spirometry 2) Transition to PO abx (augmentin for coverage of S. Milleri) 3) Remove surgical drain 4) Follow-up in IR clinic for sinogram prior to removal of flank drain 5) Plan for tentative discharge later today   LOS: 3 days   Loura PardonMatthew Lyons 09/14/2014, 8:43 AM     I performed a history and physical examination of the patient and discussed his management with the resident.  I reviewed the resident's note and agree with the documented findings and plan of care. Patient needs to eat a little more without nausea before she can be discharged.  I will re-evaluate things this afternoon, but suspect  that she won't be ready until tomorrow morning.

## 2014-09-15 LAB — GLUCOSE, CAPILLARY
Glucose-Capillary: 118 mg/dL — ABNORMAL HIGH (ref 65–99)
Glucose-Capillary: 108 mg/dL — ABNORMAL HIGH (ref 65–99)
Glucose-Capillary: 198 mg/dL — ABNORMAL HIGH (ref 65–99)
Glucose-Capillary: 176 mg/dL — ABNORMAL HIGH (ref 65–99)

## 2014-09-15 LAB — BASIC METABOLIC PANEL
Anion gap: 12 (ref 5–15)
BUN: 12 mg/dL (ref 6–20)
CO2: 23 mmol/L (ref 22–32)
CREATININE: 0.71 mg/dL (ref 0.44–1.00)
Calcium: 7.6 mg/dL — ABNORMAL LOW (ref 8.9–10.3)
Chloride: 96 mmol/L — ABNORMAL LOW (ref 101–111)
GFR calc Af Amer: >60 mL/min (ref >60)
Glucose, Bld: 122 mg/dL — ABNORMAL HIGH (ref 65–99)
Potassium: 3.5 mmol/L (ref 3.5–5.1)
SODIUM: 131 mmol/L — AB (ref 135–145)

## 2014-09-15 LAB — CBC
HCT: 26.4 % — ABNORMAL LOW (ref 36.0–46.0)
HEMOGLOBIN: 8.2 g/dL — AB (ref 12.0–15.0)
MCH: 23.6 pg — ABNORMAL LOW (ref 26.0–34.0)
MCHC: 31.1 g/dL (ref 30.0–36.0)
MCV: 75.9 fL — AB (ref 78.0–100.0)
PLATELETS: 298 10*3/uL (ref 150–400)
RBC: 3.48 MIL/uL — AB (ref 3.87–5.11)
RDW: 15.2 % (ref 11.5–15.5)
WBC: 9.1 10*3/uL (ref 4.0–10.5)

## 2014-09-15 LAB — AMYLASE: Amylase: 17 U/L — ABNORMAL LOW (ref 28–100)

## 2014-09-15 LAB — AMYLASE, PERITONEAL FLUID: Amylase, peritoneal fluid: 5 U/L

## 2014-09-15 MED ORDER — PANTOPRAZOLE SODIUM 40 MG PO TBEC
40.0000 mg | DELAYED_RELEASE_TABLET | Freq: Every day | ORAL | Status: DC
  Administered 2014-09-15: 40 mg via ORAL
  Filled 2014-09-15: qty 1

## 2014-09-15 MED ORDER — ACETAMINOPHEN 500 MG PO TABS
500.0000 mg | ORAL_TABLET | Freq: Four times a day (QID) | ORAL | Status: AC | PRN
  Administered 2014-09-15: 500 mg via ORAL
  Filled 2014-09-15: qty 1

## 2014-09-15 NOTE — Progress Notes (Signed)
The patient is receiving Protonix by the intravenous route.  Based on criteria approved by the Pharmacy and Therapeutics Committee and the Medical Executive Committee, the medication is being converted to the equivalent oral dose form.  These criteria include: -No Active GI bleeding -Able to tolerate diet of full liquids (or better) or tube feeding -Able to tolerate other medications by the oral or enteral route  If you have any questions about this conversion, please contact the Pharmacy Department (phone [phone]).  Thank you.  Thank you  Otho BellowsGreen, Goble Fudala L PharmD Pager [phone] 09/15/2014, 9:28 AM

## 2014-09-15 NOTE — Progress Notes (Signed)
Patient ID: Catherine Ali, female   DOB: [birth year], 59 y.o.   MRN: [id number]  4 Days Post-Op Subjective: Pt is gradually improving.  She did eat a little for dinner last night and stated that she did not vomit.  However, she states she has very little appetite.  She is still passing flatus.  She has ambulated very little. Pain is controlled.  Objective: Vital signs in last 24 hours: Temp:  [98.4 F (36.9 C)-100.1 F (37.8 C)] 98.4 F (36.9 C) (07/02 0654) Pulse Rate:  [95-99] 95 (07/02 0416) Resp:  [20] 20 (07/02 0416) BP: (126-134)/(53-62) 130/55 mmHg (07/02 0416) SpO2:  [93 %-95 %] 93 % (07/02 0416)  Intake/Output from previous day: 07/01 0701 - 07/02 0700 In: 120 [P.O.:120] Out: 1842 [Urine:1750; Drains:92] Intake/Output this shift:    Physical Exam:  General: Alert and oriented CV: RRR Lungs: Clear Abdomen: Soft, ND, NT, Normal BS, left flank and left subcutaneous drains in place with minimal drainage Incisions: C/D/I Ext: NT, No erythema  Lab Results:  Recent Labs  09/13/14 0355 09/14/14 0540 09/15/14 0524  HGB 9.0* 8.5* 8.2*  HCT 28.6* 26.5* 26.4*   Lab Results  Component Value Date   WBC 9.1 09/15/2014   HGB 8.2* 09/15/2014   HCT 26.4* 09/15/2014   MCV 75.9* 09/15/2014   PLT 298 09/15/2014     BMET  Recent Labs  09/14/14 0540 09/15/14 0524  NA 133* 131*  K 3.3* 3.5  CL 99* 96*  CO2 25 23  GLUCOSE 100* 122*  BUN 10 12  CREATININE 0.76 0.71  CALCIUM 7.9* 7.6*     Studies/Results: No results found.  Assessment/Plan: S/P left L/S nephrectomy for XGP - I strongly recommended that she ambulated and perform IS to avoid DVT/pneumonia. - I encouraged her to increase her po intake.  She was offered dietary supplements which she declines.  -  Will reassess this afternoon and if ambulating and tolerating po intake, she can likely be discharged home at that time.   LOS: 4 days   Termaine Roupp,LES 09/15/2014, 9:01 AM

## 2014-09-15 NOTE — Progress Notes (Signed)
Notified on call- Dr Laverle PatterBorden about patient's elevated temp this am. New order received for Tylenol 500 mg po q 6 hrs PRN .   09/15/14 0416  Vitals  Temp 100.1 F (37.8 C)  Temp Source Oral  BP (!) 130/55 mmHg  BP Location Left Arm  BP Method Automatic  Patient Position (if appropriate) Lying  Pulse Rate 95  Pulse Rate Source Dinamap  ECG Heart Rate 73  Resp 20  Oxygen Therapy  SpO2 93 %  O2 Device Room Air

## 2014-09-15 NOTE — Progress Notes (Signed)
Patient ID: Catherine Ali, female   DOB: May 19, 1955, 59 y.o.   MRN: [id number]  Pt doing better.  Tolerating at least 50% of meals.  Ambulating well.  Still passing flatus.  Abdomen with mild distention but good bowel sounds.  Flank drain removed. Subcutaneous drain left in place.  Plan: D/C home.

## 2014-09-15 NOTE — Progress Notes (Signed)
Went over all discharge information with pt.  All questions answered.  Prescriptions and AVS given.  No prescription for the senna given, spoke with pharmacy and confirmed that it could be given over the counter.  Pt waiting on ride, will be wheeled out by NT.

## 2014-09-18 ENCOUNTER — Ambulatory Visit (HOSPITAL_COMMUNITY)
Admit: 2014-09-18 | Discharge: 2014-09-18 | Disposition: A | Discharge status: Home / Self Care | Payer: BLUE CROSS/BLUE SHIELD | Source: Ambulatory Visit | Attending: Radiology | Admitting: Radiology

## 2014-09-18 DIAGNOSIS — N12 Tubulo-interstitial nephritis, not specified as acute or chronic: Secondary | ICD-10-CM | POA: Diagnosis not present

## 2014-09-18 DIAGNOSIS — Z4803 Encounter for change or removal of drains: Secondary | ICD-10-CM | POA: Insufficient documentation

## 2014-09-18 MED ORDER — IOHEXOL 300 MG/ML  SOLN
50.0000 mL | Freq: Once | INTRAMUSCULAR | Status: AC | PRN
  Administered 2014-09-18: 10 mL via INTRAVENOUS

## 2014-09-18 NOTE — Procedures (Signed)
Fluoroscopic guided injection of remaining percutaneous drainage catheter within the left flank demonstrates a near complete decompression of abscess cavity without fistulous connection to any adjacent organ. Drainage catheter removed intact without complication.

## 2014-09-24 LAB — GLUCOSE, CAPILLARY: Glucose-Capillary: 39 mg/dL — CL (ref 65–99)

## 2014-10-04 LAB — AFB CULTURE WITH SMEAR (NOT AT ARMC)
Acid Fast Smear: NONE SEEN
Acid Fast Smear: NONE SEEN
SPECIAL REQUESTS: NORMAL
Special Requests: NORMAL

## 2015-01-09 ENCOUNTER — Other Ambulatory Visit: Payer: Self-pay

## 2015-01-09 DIAGNOSIS — Z1231 Encounter for screening mammogram for malignant neoplasm of breast: Secondary | ICD-10-CM

## 2015-01-24 ENCOUNTER — Ambulatory Visit
Admission: RE | Admit: 2015-01-24 | Discharge: 2015-01-24 | Disposition: A | Discharge status: Home / Self Care | Payer: BLUE CROSS/BLUE SHIELD | Source: Ambulatory Visit

## 2015-01-24 DIAGNOSIS — Z1231 Encounter for screening mammogram for malignant neoplasm of breast: Secondary | ICD-10-CM

## 2016-04-09 DIAGNOSIS — E1165 Type 2 diabetes mellitus with hyperglycemia: Secondary | ICD-10-CM | POA: Diagnosis not present

## 2016-04-14 DIAGNOSIS — Z23 Encounter for immunization: Secondary | ICD-10-CM | POA: Diagnosis not present

## 2016-04-14 DIAGNOSIS — E1165 Type 2 diabetes mellitus with hyperglycemia: Secondary | ICD-10-CM | POA: Diagnosis not present

## 2016-05-19 DIAGNOSIS — E1165 Type 2 diabetes mellitus with hyperglycemia: Secondary | ICD-10-CM | POA: Diagnosis not present

## 2016-07-16 DIAGNOSIS — E1165 Type 2 diabetes mellitus with hyperglycemia: Secondary | ICD-10-CM | POA: Diagnosis not present

## 2016-07-21 DIAGNOSIS — E7800 Pure hypercholesterolemia, unspecified: Secondary | ICD-10-CM | POA: Diagnosis not present

## 2016-07-21 DIAGNOSIS — R030 Elevated blood-pressure reading, without diagnosis of hypertension: Secondary | ICD-10-CM | POA: Diagnosis not present

## 2016-07-21 DIAGNOSIS — E1165 Type 2 diabetes mellitus with hyperglycemia: Secondary | ICD-10-CM | POA: Diagnosis not present

## 2016-08-21 DIAGNOSIS — E1165 Type 2 diabetes mellitus with hyperglycemia: Secondary | ICD-10-CM | POA: Diagnosis not present

## 2016-08-21 DIAGNOSIS — R030 Elevated blood-pressure reading, without diagnosis of hypertension: Secondary | ICD-10-CM | POA: Diagnosis not present

## 2016-09-17 DIAGNOSIS — B9620 Unspecified Escherichia coli [E. coli] as the cause of diseases classified elsewhere: Secondary | ICD-10-CM | POA: Diagnosis not present

## 2016-09-17 DIAGNOSIS — N390 Urinary tract infection, site not specified: Secondary | ICD-10-CM | POA: Diagnosis not present

## 2016-09-17 DIAGNOSIS — R310 Gross hematuria: Secondary | ICD-10-CM | POA: Diagnosis not present

## 2016-09-29 DIAGNOSIS — N3001 Acute cystitis with hematuria: Secondary | ICD-10-CM | POA: Diagnosis not present

## 2016-10-21 DIAGNOSIS — E7800 Pure hypercholesterolemia, unspecified: Secondary | ICD-10-CM | POA: Diagnosis not present

## 2016-10-21 DIAGNOSIS — E1165 Type 2 diabetes mellitus with hyperglycemia: Secondary | ICD-10-CM | POA: Diagnosis not present

## 2016-10-23 DIAGNOSIS — E1165 Type 2 diabetes mellitus with hyperglycemia: Secondary | ICD-10-CM | POA: Diagnosis not present

## 2016-10-23 DIAGNOSIS — E782 Mixed hyperlipidemia: Secondary | ICD-10-CM | POA: Diagnosis not present

## 2017-01-07 DIAGNOSIS — E1165 Type 2 diabetes mellitus with hyperglycemia: Secondary | ICD-10-CM | POA: Diagnosis not present

## 2017-01-07 DIAGNOSIS — R030 Elevated blood-pressure reading, without diagnosis of hypertension: Secondary | ICD-10-CM | POA: Diagnosis not present

## 2017-01-19 DIAGNOSIS — Z0000 Encounter for general adult medical examination without abnormal findings: Secondary | ICD-10-CM | POA: Diagnosis not present

## 2017-01-19 DIAGNOSIS — E1165 Type 2 diabetes mellitus with hyperglycemia: Secondary | ICD-10-CM | POA: Diagnosis not present

## 2017-01-21 DIAGNOSIS — Z1211 Encounter for screening for malignant neoplasm of colon: Secondary | ICD-10-CM | POA: Diagnosis not present

## 2017-01-21 DIAGNOSIS — E1165 Type 2 diabetes mellitus with hyperglycemia: Secondary | ICD-10-CM | POA: Diagnosis not present

## 2017-01-21 DIAGNOSIS — R030 Elevated blood-pressure reading, without diagnosis of hypertension: Secondary | ICD-10-CM | POA: Diagnosis not present

## 2017-01-21 DIAGNOSIS — Z0000 Encounter for general adult medical examination without abnormal findings: Secondary | ICD-10-CM | POA: Diagnosis not present

## 2017-04-27 DIAGNOSIS — E1165 Type 2 diabetes mellitus with hyperglycemia: Secondary | ICD-10-CM | POA: Diagnosis not present

## 2017-04-27 DIAGNOSIS — E782 Mixed hyperlipidemia: Secondary | ICD-10-CM | POA: Diagnosis not present

## 2017-04-27 DIAGNOSIS — R030 Elevated blood-pressure reading, without diagnosis of hypertension: Secondary | ICD-10-CM | POA: Diagnosis not present

## 2017-04-30 DIAGNOSIS — Z23 Encounter for immunization: Secondary | ICD-10-CM | POA: Diagnosis not present

## 2017-04-30 DIAGNOSIS — E1165 Type 2 diabetes mellitus with hyperglycemia: Secondary | ICD-10-CM | POA: Diagnosis not present

## 2017-04-30 DIAGNOSIS — E782 Mixed hyperlipidemia: Secondary | ICD-10-CM | POA: Diagnosis not present

## 2017-05-31 DIAGNOSIS — I10 Essential (primary) hypertension: Secondary | ICD-10-CM | POA: Diagnosis not present

## 2017-06-03 DIAGNOSIS — W19XXXD Unspecified fall, subsequent encounter: Secondary | ICD-10-CM | POA: Diagnosis not present

## 2017-06-03 DIAGNOSIS — N200 Calculus of kidney: Secondary | ICD-10-CM | POA: Diagnosis not present

## 2017-06-03 DIAGNOSIS — I10 Essential (primary) hypertension: Secondary | ICD-10-CM | POA: Diagnosis not present

## 2017-08-02 DIAGNOSIS — I10 Essential (primary) hypertension: Secondary | ICD-10-CM | POA: Diagnosis not present

## 2017-08-02 DIAGNOSIS — E782 Mixed hyperlipidemia: Secondary | ICD-10-CM | POA: Diagnosis not present

## 2017-08-02 DIAGNOSIS — E1165 Type 2 diabetes mellitus with hyperglycemia: Secondary | ICD-10-CM | POA: Diagnosis not present

## 2017-08-04 DIAGNOSIS — E782 Mixed hyperlipidemia: Secondary | ICD-10-CM | POA: Diagnosis not present

## 2017-08-04 DIAGNOSIS — E1165 Type 2 diabetes mellitus with hyperglycemia: Secondary | ICD-10-CM | POA: Diagnosis not present

## 2017-08-04 DIAGNOSIS — I10 Essential (primary) hypertension: Secondary | ICD-10-CM | POA: Diagnosis not present

## 2017-09-28 DIAGNOSIS — E782 Mixed hyperlipidemia: Secondary | ICD-10-CM | POA: Diagnosis not present

## 2017-09-30 DIAGNOSIS — I10 Essential (primary) hypertension: Secondary | ICD-10-CM | POA: Diagnosis not present

## 2017-09-30 DIAGNOSIS — E782 Mixed hyperlipidemia: Secondary | ICD-10-CM | POA: Diagnosis not present

## 2017-09-30 DIAGNOSIS — E1165 Type 2 diabetes mellitus with hyperglycemia: Secondary | ICD-10-CM | POA: Diagnosis not present

## 2017-10-08 DIAGNOSIS — R309 Painful micturition, unspecified: Secondary | ICD-10-CM | POA: Diagnosis not present

## 2017-11-09 DIAGNOSIS — E1165 Type 2 diabetes mellitus with hyperglycemia: Secondary | ICD-10-CM | POA: Diagnosis not present

## 2017-11-09 DIAGNOSIS — E782 Mixed hyperlipidemia: Secondary | ICD-10-CM | POA: Diagnosis not present

## 2017-11-09 DIAGNOSIS — E7800 Pure hypercholesterolemia, unspecified: Secondary | ICD-10-CM | POA: Diagnosis not present

## 2017-11-11 DIAGNOSIS — Z23 Encounter for immunization: Secondary | ICD-10-CM | POA: Diagnosis not present

## 2017-11-11 DIAGNOSIS — E1165 Type 2 diabetes mellitus with hyperglycemia: Secondary | ICD-10-CM | POA: Diagnosis not present

## 2017-11-11 DIAGNOSIS — E782 Mixed hyperlipidemia: Secondary | ICD-10-CM | POA: Diagnosis not present

## 2018-01-24 DIAGNOSIS — Z0000 Encounter for general adult medical examination without abnormal findings: Secondary | ICD-10-CM | POA: Diagnosis not present

## 2018-01-26 DIAGNOSIS — Z01419 Encounter for gynecological examination (general) (routine) without abnormal findings: Secondary | ICD-10-CM | POA: Diagnosis not present

## 2018-01-26 DIAGNOSIS — Z6838 Body mass index (BMI) 38.0-38.9, adult: Secondary | ICD-10-CM | POA: Diagnosis not present

## 2018-01-26 DIAGNOSIS — Z118 Encounter for screening for other infectious and parasitic diseases: Secondary | ICD-10-CM | POA: Diagnosis not present

## 2018-01-26 DIAGNOSIS — Z0000 Encounter for general adult medical examination without abnormal findings: Secondary | ICD-10-CM | POA: Diagnosis not present

## 2018-01-26 DIAGNOSIS — Z1211 Encounter for screening for malignant neoplasm of colon: Secondary | ICD-10-CM | POA: Diagnosis not present

## 2018-01-26 DIAGNOSIS — E782 Mixed hyperlipidemia: Secondary | ICD-10-CM | POA: Diagnosis not present

## 2018-01-26 DIAGNOSIS — N760 Acute vaginitis: Secondary | ICD-10-CM | POA: Diagnosis not present

## 2018-01-26 DIAGNOSIS — Z23 Encounter for immunization: Secondary | ICD-10-CM | POA: Diagnosis not present

## 2018-01-31 ENCOUNTER — Other Ambulatory Visit: Payer: Self-pay | Admitting: Family Medicine

## 2018-01-31 DIAGNOSIS — Z1231 Encounter for screening mammogram for malignant neoplasm of breast: Secondary | ICD-10-CM

## 2018-02-15 ENCOUNTER — Other Ambulatory Visit: Payer: Self-pay | Admitting: Family Medicine

## 2018-02-15 DIAGNOSIS — Z1231 Encounter for screening mammogram for malignant neoplasm of breast: Secondary | ICD-10-CM

## 2018-02-24 DIAGNOSIS — E1165 Type 2 diabetes mellitus with hyperglycemia: Secondary | ICD-10-CM | POA: Diagnosis not present

## 2018-02-25 DIAGNOSIS — E1165 Type 2 diabetes mellitus with hyperglycemia: Secondary | ICD-10-CM | POA: Diagnosis not present

## 2018-03-03 DIAGNOSIS — E1165 Type 2 diabetes mellitus with hyperglycemia: Secondary | ICD-10-CM | POA: Diagnosis not present

## 2018-03-03 DIAGNOSIS — Z6838 Body mass index (BMI) 38.0-38.9, adult: Secondary | ICD-10-CM | POA: Diagnosis not present

## 2018-03-21 DIAGNOSIS — Z79899 Other long term (current) drug therapy: Secondary | ICD-10-CM | POA: Diagnosis not present

## 2018-03-21 DIAGNOSIS — E669 Obesity, unspecified: Secondary | ICD-10-CM | POA: Diagnosis not present

## 2018-03-21 DIAGNOSIS — Z794 Long term (current) use of insulin: Secondary | ICD-10-CM | POA: Diagnosis not present

## 2018-03-21 DIAGNOSIS — E1165 Type 2 diabetes mellitus with hyperglycemia: Secondary | ICD-10-CM | POA: Diagnosis not present

## 2018-03-25 ENCOUNTER — Ambulatory Visit
Admission: RE | Admit: 2018-03-25 | Discharge: 2018-03-25 | Disposition: A | Discharge status: Home / Self Care | Payer: 59 | Source: Ambulatory Visit | Attending: Family Medicine | Admitting: Family Medicine

## 2018-03-25 DIAGNOSIS — Z1231 Encounter for screening mammogram for malignant neoplasm of breast: Secondary | ICD-10-CM | POA: Diagnosis not present

## 2018-06-22 DIAGNOSIS — E669 Obesity, unspecified: Secondary | ICD-10-CM | POA: Diagnosis not present

## 2018-06-22 DIAGNOSIS — E119 Type 2 diabetes mellitus without complications: Secondary | ICD-10-CM | POA: Diagnosis not present

## 2018-06-22 DIAGNOSIS — Z794 Long term (current) use of insulin: Secondary | ICD-10-CM | POA: Diagnosis not present

## 2019-01-16 ENCOUNTER — Other Ambulatory Visit: Payer: Self-pay

## 2019-01-16 ENCOUNTER — Ambulatory Visit (INDEPENDENT_AMBULATORY_CARE_PROVIDER_SITE_OTHER): Payer: 59

## 2019-01-16 ENCOUNTER — Encounter: Payer: Self-pay | Admitting: Podiatry

## 2019-01-16 ENCOUNTER — Ambulatory Visit: Payer: 59 | Admitting: Podiatry

## 2019-01-16 DIAGNOSIS — M2041 Other hammer toe(s) (acquired), right foot: Secondary | ICD-10-CM | POA: Diagnosis not present

## 2019-01-16 DIAGNOSIS — M7751 Other enthesopathy of right foot: Secondary | ICD-10-CM | POA: Diagnosis not present

## 2019-01-16 DIAGNOSIS — M7752 Other enthesopathy of left foot: Secondary | ICD-10-CM

## 2019-01-16 DIAGNOSIS — M2042 Other hammer toe(s) (acquired), left foot: Secondary | ICD-10-CM

## 2019-01-16 DIAGNOSIS — B351 Tinea unguium: Secondary | ICD-10-CM | POA: Diagnosis not present

## 2019-01-16 DIAGNOSIS — M779 Enthesopathy, unspecified: Secondary | ICD-10-CM

## 2019-01-16 NOTE — Patient Instructions (Addendum)
Hammer Toe  Hammer toe is a change in the shape (a deformity) of your toe. The deformity causes the middle joint of your toe to stay bent. This causes pain, especially when you are wearing shoes. Hammer toe starts gradually. At first, the toe can be straightened. Gradually over time, the deformity becomes stiff and permanent. Early treatments to keep the toe straight may relieve pain. As the deformity becomes stiff and permanent, surgery may be needed to straighten the toe. What are the causes? Hammer toe is caused by abnormal bending of the toe joint that is closest to your foot. It happens gradually over time. This pulls on the muscles and connections (tendons) of the toe joint, making them weak and stiff. It is often related to wearing shoes that are too short or narrow and do not let your toes straighten. What increases the risk? You may be at greater risk for hammer toe if you:  Are female.  Are older.  Wear shoes that are too small.  Wear high-heeled shoes that pinch your toes.  Are a ballet dancer.  Have a second toe that is longer than your big toe (first toe).  Injure your foot or toe.  Have arthritis.  Have a family history of hammer toe.  Have a nerve or muscle disorder. What are the signs or symptoms? The main symptoms of this condition are pain and deformity of the toe. The pain is worse when wearing shoes, walking, or running. Other symptoms may include:  Corns or calluses over the bent part of the toe or between the toes.  Redness and a burning feeling on the toe.  An open sore that forms on the top of the toe.  Not being able to straighten the toe. How is this diagnosed? This condition is diagnosed based on your symptoms and a physical exam. During the exam, your health care provider will try to straighten your toe to see how stiff the deformity is. You may also have tests, such as:  A blood test to check for rheumatoid arthritis.  An X-ray to show how  severe the deformity is. How is this treated? Treatment for this condition will depend on how stiff the deformity is. Surgery is often needed. However, sometimes a hammer toe can be straightened without surgery. Treatments that do not involve surgery include:  Taping the toe into a straightened position.  Using pads and cushions to protect the toe (orthotics).  Wearing shoes that provide enough room for the toes.  Doing toe-stretching exercises at home.  Taking an NSAID to reduce pain and swelling. If these treatments do not help or the toe cannot be straightened, surgery is the next option. The most common surgeries used to straighten a hammer toe include:  Arthroplasty. In this procedure, part of the joint is removed, and that allows the toe to straighten.  Fusion. In this procedure, cartilage between the two bones of the joint is taken out and the bones are fused together into one longer bone.  Implantation. In this procedure, part of the bone is removed and replaced with an implant to let the toe move again.  Flexor tendon transfer. In this procedure, the tendons that curl the toes down (flexor tendons) are repositioned. Follow these instructions at home:  Take over-the-counter and prescription medicines only as told by your health care provider.  Do toe straightening and stretching exercises as told by your health care provider.  Keep all follow-up visits as told by your health care   provider. This is important. How is this prevented?  Wear shoes that give your toes enough room and do not cause pain.  Do not wear high-heeled shoes. Contact a health care provider if:  Your pain gets worse.  Your toe becomes red or swollen.  You develop an open sore on your toe. This information is not intended to replace advice given to you by your health care provider. Make sure you discuss any questions you have with your health care provider. Document Released: 02/28/2000 Document  Revised: 02/12/2017 Document Reviewed: 06/26/2015 Elsevier Patient Education  2020 [employer].    Plantar Fasciitis (Heel Spur Syndrome) with Rehab The plantar fascia is a fibrous, ligament-like, soft-tissue structure that spans the bottom of the foot. Plantar fasciitis is a condition that causes pain in the foot due to inflammation of the tissue. SYMPTOMS   Pain and tenderness on the underneath side of the foot.  Pain that worsens with standing or walking. CAUSES  Plantar fasciitis is caused by irritation and injury to the plantar fascia on the underneath side of the foot. Common mechanisms of injury include:  Direct trauma to bottom of the foot.  Damage to a small nerve that runs under the foot where the main fascia attaches to the heel bone.  Stress placed on the plantar fascia due to bone spurs. RISK INCREASES WITH:   Activities that place stress on the plantar fascia (running, jumping, pivoting, or cutting).  Poor strength and flexibility.  Improperly fitted shoes.  Tight calf muscles.  Flat feet.  Failure to warm-up properly before activity.  Obesity. PREVENTION  Warm up and stretch properly before activity.  Allow for adequate recovery between workouts.  Maintain physical fitness:  Strength, flexibility, and endurance.  Cardiovascular fitness.  Maintain a health body weight.  Avoid stress on the plantar fascia.  Wear properly fitted shoes, including arch supports for individuals who have flat feet.  PROGNOSIS  If treated properly, then the symptoms of plantar fasciitis usually resolve without surgery. However, occasionally surgery is necessary.  RELATED COMPLICATIONS   Recurrent symptoms that may result in a chronic condition.  Problems of the lower back that are caused by compensating for the injury, such as limping.  Pain or weakness of the foot during push-off following surgery.  Chronic inflammation, scarring, and partial or complete  fascia tear, occurring more often from repeated injections.  TREATMENT  Treatment initially involves the use of ice and medication to help reduce pain and inflammation. The use of strengthening and stretching exercises may help reduce pain with activity, especially stretches of the Achilles tendon. These exercises may be performed at home or with a therapist. Your caregiver may recommend that you use heel cups of arch supports to help reduce stress on the plantar fascia. Occasionally, corticosteroid injections are given to reduce inflammation. If symptoms persist for greater than 6 months despite non-surgical (conservative), then surgery may be recommended.   MEDICATION   If pain medication is necessary, then nonsteroidal anti-inflammatory medications, such as aspirin and ibuprofen, or other minor pain relievers, such as acetaminophen, are often recommended.  Do not take pain medication within 7 days before surgery.  Prescription pain relievers may be given if deemed necessary by your caregiver. Use only as directed and only as much as you need.  Corticosteroid injections may be given by your caregiver. These injections should be reserved for the most serious cases, because they may only be given a certain number of times.  HEAT AND COLD  Cold treatment (icing) relieves pain and reduces inflammation. Cold treatment should be applied for 10 to 15 minutes every 2 to 3 hours for inflammation and pain and immediately after any activity that aggravates your symptoms. Use ice packs or massage the area with a piece of ice (ice massage).  Heat treatment may be used prior to performing the stretching and strengthening activities prescribed by your caregiver, physical therapist, or athletic trainer. Use a heat pack or soak the injury in warm water.  SEEK IMMEDIATE MEDICAL CARE IF:  Treatment seems to offer no benefit, or the condition worsens.  Any medications produce adverse side effects.   EXERCISES- RANGE OF MOTION (ROM) AND STRETCHING EXERCISES - Plantar Fasciitis (Heel Spur Syndrome) These exercises may help you when beginning to rehabilitate your injury. Your symptoms may resolve with or without further involvement from your physician, physical therapist or athletic trainer. While completing these exercises, remember:   Restoring tissue flexibility helps normal motion to return to the joints. This allows healthier, less painful movement and activity.  An effective stretch should be held for at least 30 seconds.  A stretch should never be painful. You should only feel a gentle lengthening or release in the stretched tissue.  RANGE OF MOTION - Toe Extension, Flexion  Sit with your right / left leg crossed over your opposite knee.  Grasp your toes and gently pull them back toward the top of your foot. You should feel a stretch on the bottom of your toes and/or foot.  Hold this stretch for 10 seconds.  Now, gently pull your toes toward the bottom of your foot. You should feel a stretch on the top of your toes and or foot.  Hold this stretch for 10 seconds. Repeat  times. Complete this stretch 3 times per day.   RANGE OF MOTION - Ankle Dorsiflexion, Active Assisted  Remove shoes and sit on a chair that is preferably not on a carpeted surface.  Place right / left foot under knee. Extend your opposite leg for support.  Keeping your heel down, slide your right / left foot back toward the chair until you feel a stretch at your ankle or calf. If you do not feel a stretch, slide your bottom forward to the edge of the chair, while still keeping your heel down.  Hold this stretch for 10 seconds. Repeat 3 times. Complete this stretch 2 times per day.   STRETCH  Gastroc, Standing  Place hands on wall.  Extend right / left leg, keeping the front knee somewhat bent.  Slightly point your toes inward on your back foot.  Keeping your right / left heel on the floor and your  knee straight, shift your weight toward the wall, not allowing your back to arch.  You should feel a gentle stretch in the right / left calf. Hold this position for 10 seconds. Repeat 3 times. Complete this stretch 2 times per day.  STRETCH  Soleus, Standing  Place hands on wall.  Extend right / left leg, keeping the other knee somewhat bent.  Slightly point your toes inward on your back foot.  Keep your right / left heel on the floor, bend your back knee, and slightly shift your weight over the back leg so that you feel a gentle stretch deep in your back calf.  Hold this position for 10 seconds. Repeat 3 times. Complete this stretch 2 times per day.  STRETCH  Gastrocsoleus, Standing  Note: This exercise can place a lot  of stress on your foot and ankle. Please complete this exercise only if specifically instructed by your caregiver.   Place the ball of your right / left foot on a step, keeping your other foot firmly on the same step.  Hold on to the wall or a rail for balance.  Slowly lift your other foot, allowing your body weight to press your heel down over the edge of the step.  You should feel a stretch in your right / left calf.  Hold this position for 10 seconds.  Repeat this exercise with a slight bend in your right / left knee. Repeat 3 times. Complete this stretch 2 times per day.   STRENGTHENING EXERCISES - Plantar Fasciitis (Heel Spur Syndrome)  These exercises may help you when beginning to rehabilitate your injury. They may resolve your symptoms with or without further involvement from your physician, physical therapist or athletic trainer. While completing these exercises, remember:   Muscles can gain both the endurance and the strength needed for everyday activities through controlled exercises.  Complete these exercises as instructed by your physician, physical therapist or athletic trainer. Progress the resistance and repetitions only as guided.  STRENGTH -  Towel Curls  Sit in a chair positioned on a non-carpeted surface.  Place your foot on a towel, keeping your heel on the floor.  Pull the towel toward your heel by only curling your toes. Keep your heel on the floor. Repeat 3 times. Complete this exercise 2 times per day.  STRENGTH - Ankle Inversion  Secure one end of a rubber exercise band/tubing to a fixed object (table, pole). Loop the other end around your foot just before your toes.  Place your fists between your knees. This will focus your strengthening at your ankle.  Slowly, pull your big toe up and in, making sure the band/tubing is positioned to resist the entire motion.  Hold this position for 10 seconds.  Have your muscles resist the band/tubing as it slowly pulls your foot back to the starting position. Repeat 3 times. Complete this exercises 2 times per day.  Document Released: 03/02/2005 Document Revised: 05/25/2011 Document Reviewed: 06/14/2008 [facility] Patient Information 2014 [place], [place].

## 2019-01-16 NOTE — Progress Notes (Signed)
   Subjective:    Patient ID: Catherine Ali, female    DOB: July 06, 1955, 63 y.o.   MRN: [id number]  HPI    Review of Systems  All other systems reviewed and are negative.      Objective:   Physical Exam        Assessment & Plan:

## 2019-01-16 NOTE — Progress Notes (Signed)
Subjective:   Patient ID: Catherine Ali, female   DOB: 63 y.o.   MRN: [id number]   HPI Patient presents stating inflammation around the second MPJ bilateral that is painful when palpated with hammertoe deformity that she states get sore on top of the toe with history of diabetes under good control with her numbers generally between 90 and 110 when she checks and patient does not smoke likes to be active   Review of Systems  All other systems reviewed and are negative.       Objective:  Physical Exam Vitals signs and nursing note reviewed.  Constitutional:      Appearance: She is well-developed.  Pulmonary:     Effort: Pulmonary effort is normal.  Musculoskeletal: Normal range of motion.  Skin:    General: Skin is warm.  Neurological:     Mental Status: She is alert.     Neurovascular status found to be intact muscle strength found to be adequate range of motion within normal limits.  Patient is found to have inflammation around the second MPJ of both feet with fluid buildup around the joint surface that is painful with elevated second toes bilateral with rigid contracture at the MPJ.  Patient is found to have good digital perfusion well oriented x3 and I did not note any diminishment of sharp dull vibratory and     Assessment:  Inflammatory capsulitis second MPJ bilateral with hammertoe deformity noted bilateral     Plan:  H&P x-rays reviewed and at this point I did do sterile prep and around the second MPJ the periclavicular junction to 3 mg dexamethasone 5 mg Xylocaine and I then applied padding to the second toes and discussed possible digital fusion depending on response.  Reappoint to reevaluate  X-rays indicate rigid contracture digit to bilateral right over left

## 2019-02-13 ENCOUNTER — Other Ambulatory Visit: Payer: Self-pay

## 2019-02-13 ENCOUNTER — Encounter: Payer: Self-pay | Admitting: Podiatry

## 2019-02-13 ENCOUNTER — Ambulatory Visit: Payer: 59 | Admitting: Podiatry

## 2019-02-13 DIAGNOSIS — M2042 Other hammer toe(s) (acquired), left foot: Secondary | ICD-10-CM | POA: Diagnosis not present

## 2019-02-13 DIAGNOSIS — M2041 Other hammer toe(s) (acquired), right foot: Secondary | ICD-10-CM | POA: Diagnosis not present

## 2019-02-13 DIAGNOSIS — M779 Enthesopathy, unspecified: Secondary | ICD-10-CM | POA: Diagnosis not present

## 2019-02-13 NOTE — Progress Notes (Signed)
Subjective:   Patient ID: Catherine Ali, female   DOB: 63 y.o.   MRN: [id number]   HPI Patient presents stating she is got quite a bit of improvement in her foot and it still hurts her if she is on it too much but overall she is doing much better than she was previously   ROS      Objective:  Physical Exam  Significant rigid contracture of the second digit bilateral with inflammation noted of the MPJs that is improved quite a bit from previous     Assessment:  Hammertoe deformity with capsulitis of the of the second MPJs bilateral that appears to be related to structural abnormalities     Plan:  H&P conditions reviewed and I discussed possibility for digital fusion and shortening osteotomies for the long-term.  At this point we will treat conservatively and ultimately that may be necessary but we are going to hold off and continue with conservative care and may require orthotics or other treatment

## 2019-05-31 IMAGING — MG DIGITAL SCREENING BILATERAL MAMMOGRAM WITH CAD
4 series · 4 of 4 positions shown · non-contrast
Comparison: Previous exam(s).

CLINICAL DATA: Screening.

EXAM:
DIGITAL SCREENING BILATERAL MAMMOGRAM WITH CAD

[L MLO]
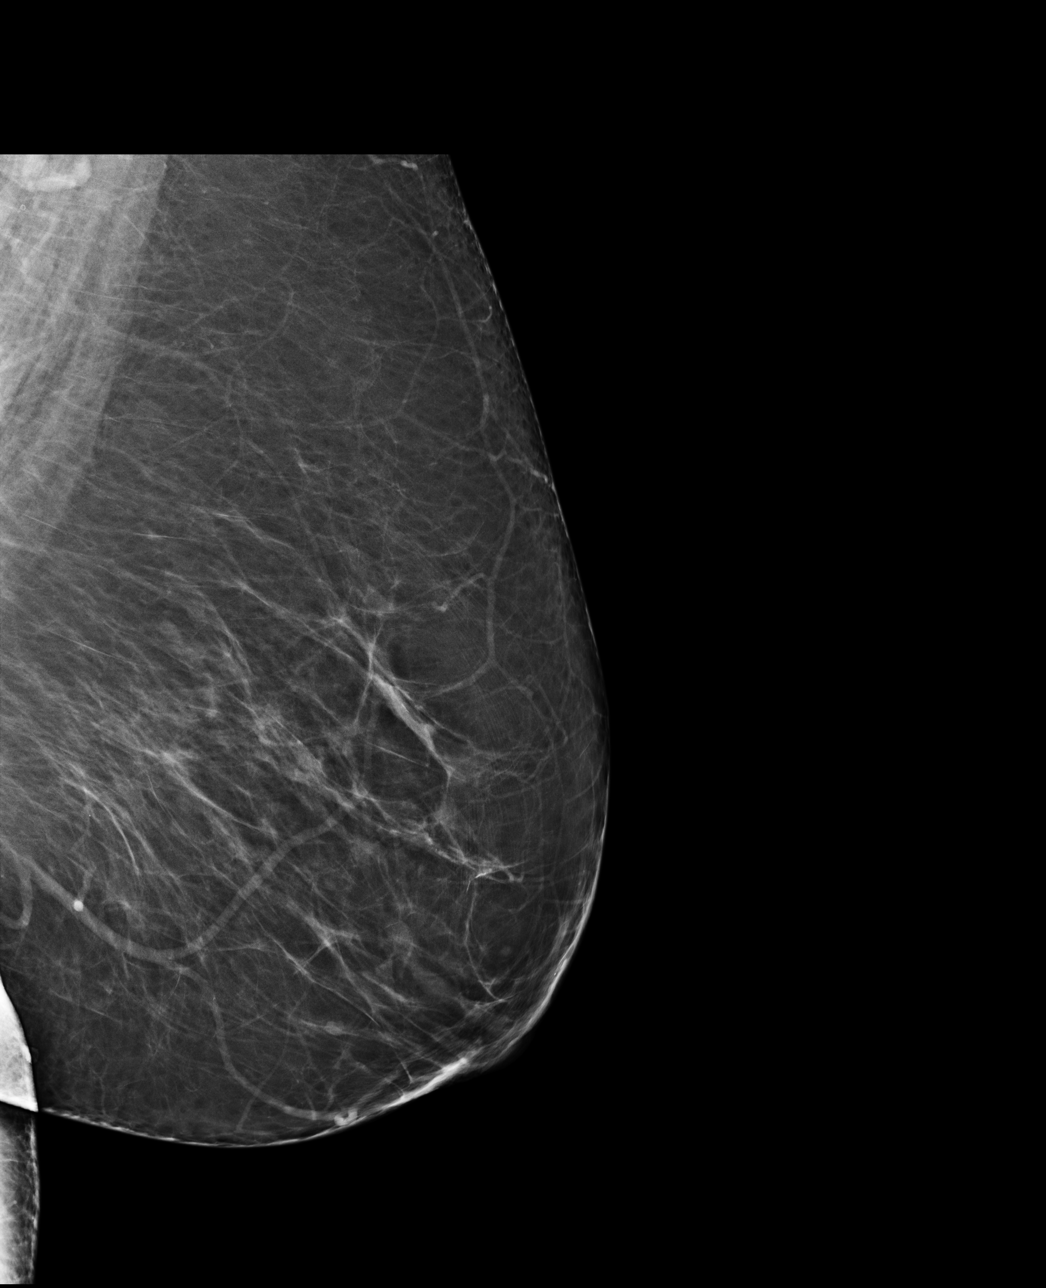

[R MLO]
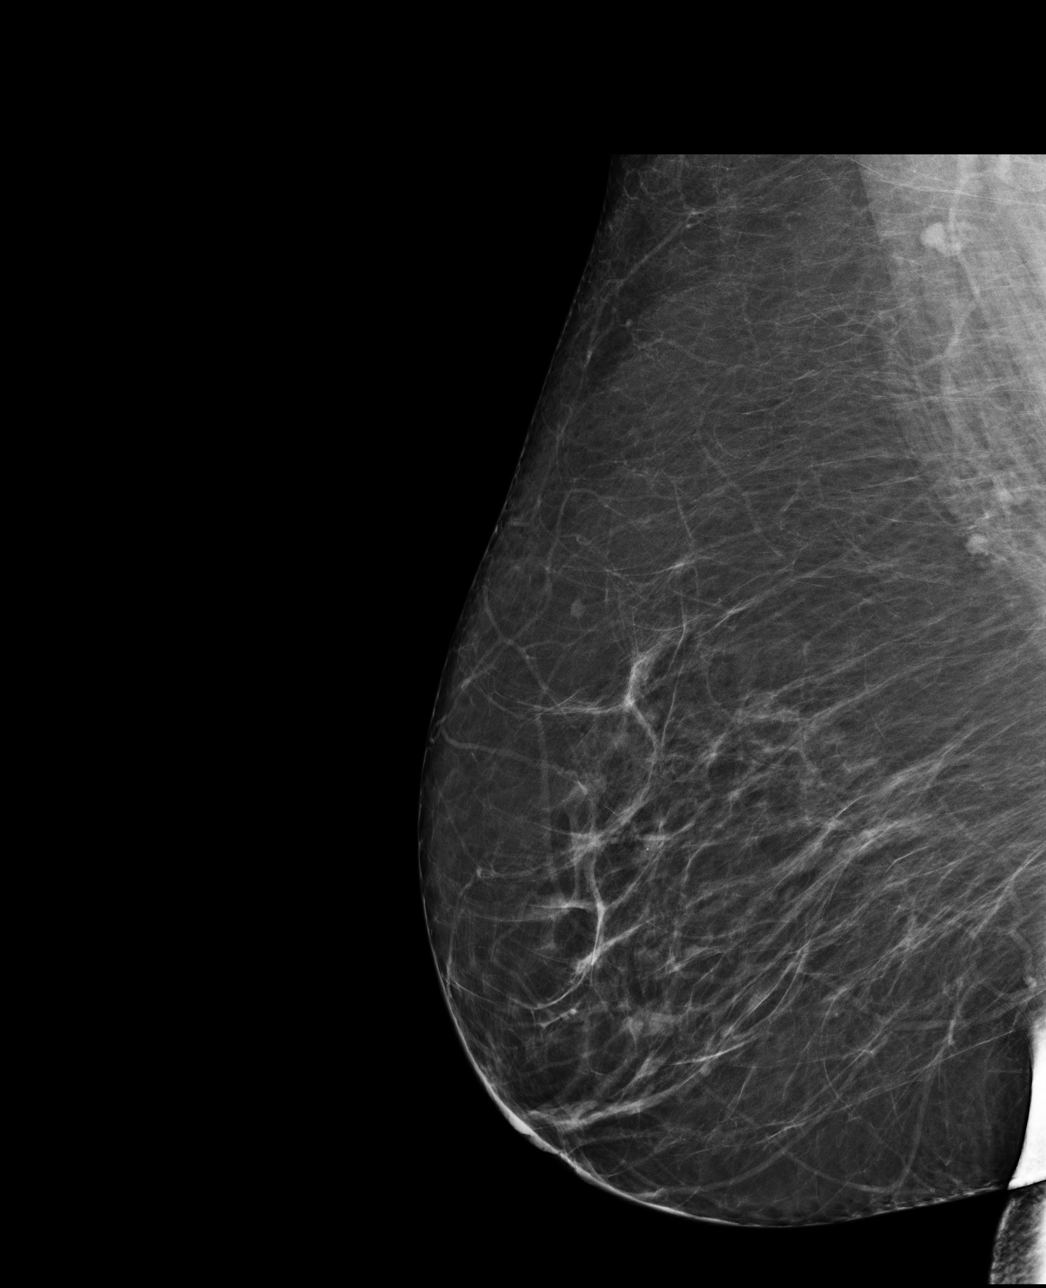

[L CC]
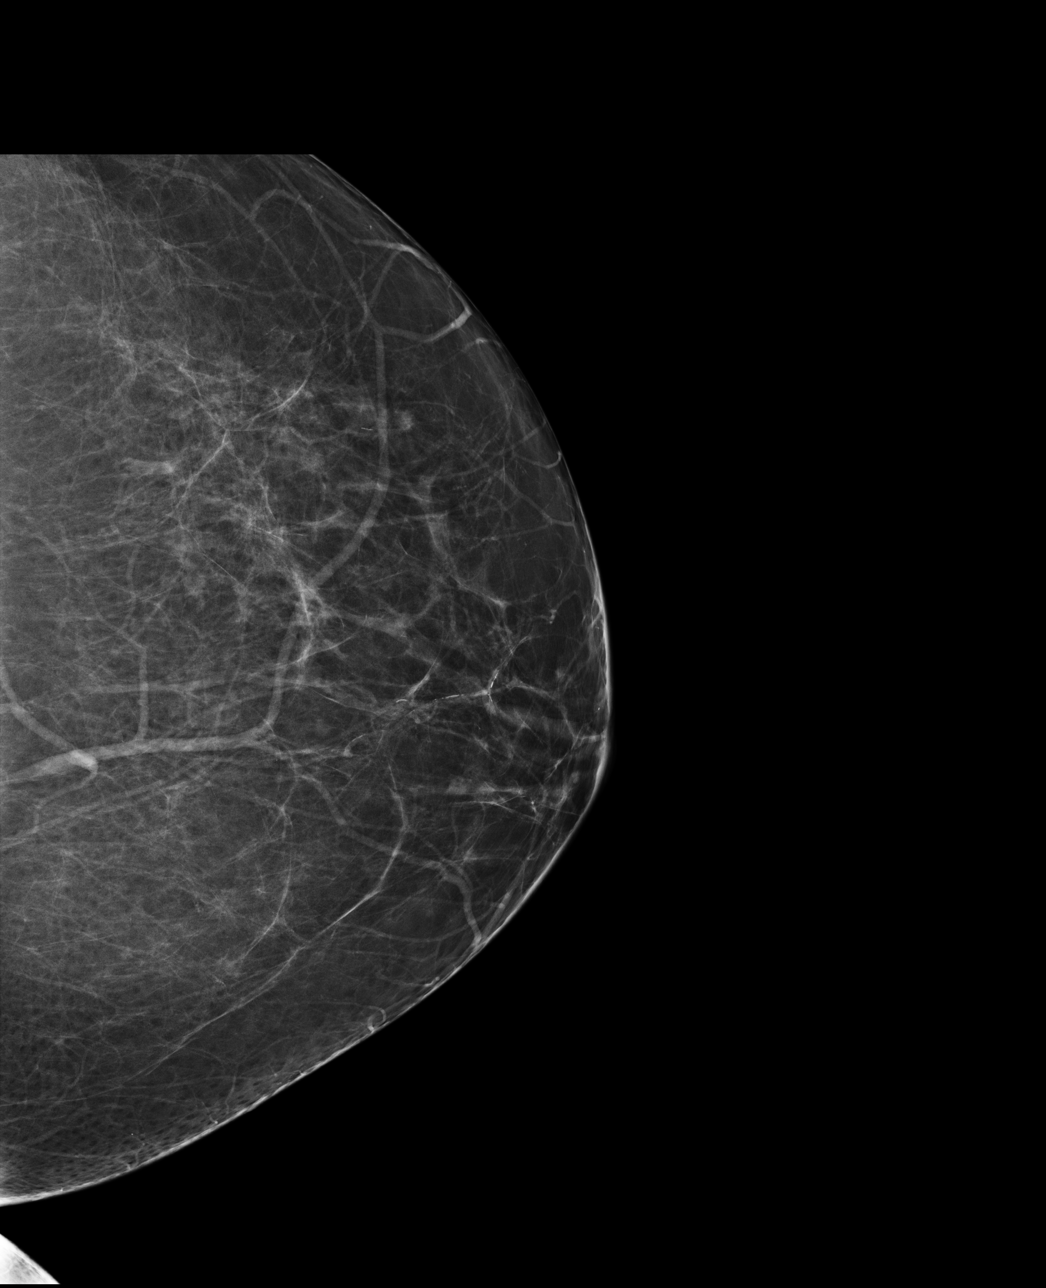

[R CC]
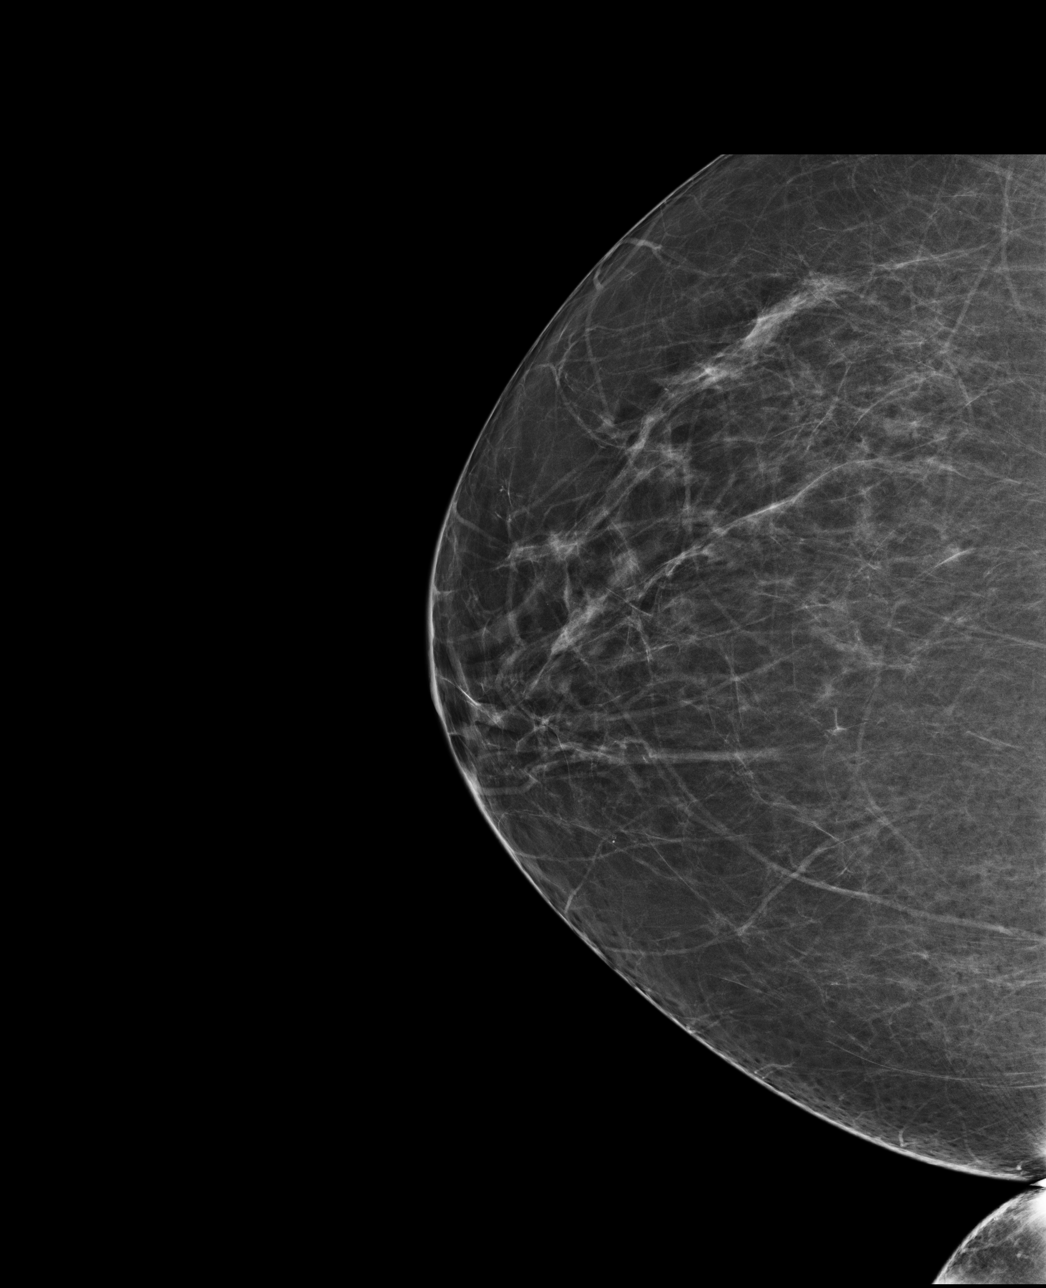

[4 of 4 positions shown; findings below may reference images not displayed]

ACR Breast Density Category b: There are scattered areas of
fibroglandular density.
FINDINGS: There are no findings suspicious for malignancy. Images were
processed with CAD.
IMPRESSION: No mammographic evidence of malignancy. A result letter of this
screening mammogram will be mailed directly to the patient.

RECOMMENDATION:
Screening mammogram in one year. (Code:AS-G-LCT)

BI-RADS CATEGORY  1: Negative.

## 2020-01-04 ENCOUNTER — Other Ambulatory Visit: Payer: 59

## 2020-01-04 DIAGNOSIS — Z20822 Contact with and (suspected) exposure to covid-19: Secondary | ICD-10-CM

## 2020-01-05 LAB — SARS-COV-2, NAA 2 DAY TAT

## 2020-01-05 LAB — NOVEL CORONAVIRUS, NAA: SARS-CoV-2, NAA: NOT DETECTED

## 2020-04-29 ENCOUNTER — Encounter: Payer: Self-pay | Admitting: Emergency Medicine

## 2020-04-29 ENCOUNTER — Ambulatory Visit
Admission: EM | Admit: 2020-04-29 | Discharge: 2020-04-29 | Disposition: A | Discharge status: Home / Self Care | Payer: 59 | Attending: Family Medicine | Admitting: Family Medicine

## 2020-04-29 DIAGNOSIS — Z23 Encounter for immunization: Secondary | ICD-10-CM

## 2020-04-29 DIAGNOSIS — S81011A Laceration without foreign body, right knee, initial encounter: Secondary | ICD-10-CM

## 2020-04-29 MED ORDER — TETANUS-DIPHTH-ACELL PERTUSSIS 5-2.5-18.5 LF-MCG/0.5 IM SUSY
0.5000 mL | PREFILLED_SYRINGE | Freq: Once | INTRAMUSCULAR | Status: AC
  Administered 2020-04-29: 0.5 mL via INTRAMUSCULAR

## 2020-04-29 MED ORDER — CEPHALEXIN 750 MG PO CAPS: 750.0000 mg | ORAL_CAPSULE | Freq: Two times a day (BID) | ORAL | 0 refills | Status: AC

## 2020-04-29 NOTE — ED Triage Notes (Signed)
Pt slipped and fell on deck last night, has laceration to LT knee

## 2020-04-29 NOTE — Discharge Instructions (Signed)
Return in 10 days to have sutures removed.  Start Keflex twice daily for the next 7 days for prophylaxis against any skin infection.  You may wash area with regular warm soap and water.  Change dressing twice daily.

## 2020-04-29 NOTE — ED Provider Notes (Signed)
[facility]    CSN: [id number] Arrival date & time: 04/29/20  1320      History   Chief Complaint Chief Complaint  Patient presents with  . Laceration    HPI Catherine Ali is a 65 y.o. female.   HPI  Patient presents for evaluation of laceration involving the left knee following a slip on her icy desk while walking the dogs x 1 day ago.  She reports the wound to knee is deep and skin flapped open. She cleansed the wound bed yesterday and today and had a sterile dressing.  Last Tdap she thinks is a little over 5 years ago and wishes to update today due to the injury.  Denies any bone related injury to me as she has full mobility.  Past Medical History:  Diagnosis Date  . Anemia   . Anxiety   . Calculus of kidney   . Chronic kidney disease   . Diabetes mellitus without complication (HCC)   . Hyponatremia   . Perinephric abscess   . Skull fracture (HCC)    hx of at age 14  . Xanthogranulomatous pyelonephritis     Patient Active Problem List   Diagnosis Date Noted  . 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 Xanthogranulomatous pyelonephritis   . Type 2 diabetes mellitus (HCC) 08/21/2014  . Renal abscess, left 08/21/2014  . Hyponatremia 08/21/2014    Past Surgical History:  Procedure Laterality Date  . LAPAROSCOPIC NEPHRECTOMY Left 09/11/2014   Procedure: LEFT LAPAROSCOPIC NEPHRECTOMY;  Surgeon: Crist Fat, MD;  Location: [facility];  Service: Urology;  Laterality: Left;  . perinephric drain      times 2  . TUBAL LIGATION     clipped at age 64    OB History   No obstetric history on file.      Home Medications    Prior to Admission medications   Medication Sig Start Date End Date Taking? Authorizing Provider  cephALEXin (KEFLEX) 750 MG capsule Take 1 capsule (750 mg total) by mouth in the morning and at bedtime for 7 days. 04/29/20 05/06/20 Yes Bing Neighbors, FNP  Cinnamon 500 MG capsule Take 1,000 mg by mouth daily.     [provider]  Insulin Glargine (TOUJEO SOLOSTAR) 300 UNIT/ML SOPN Inject 20 Units into the skin daily.    [provider]  metFORMIN (GLUCOPHAGE) 500 MG tablet Take 500 mg by mouth 2 (two) times daily with a meal.    [provider]  Multiple Vitamin (MULTIVITAMIN) tablet Take 1 tablet by mouth daily.    [provider]    Family History No family history on file.  Social History Social History   Tobacco Use  . Smoking status: Never Smoker  . Smokeless tobacco: Never Used  Substance Use Topics  . Alcohol use: Yes    Comment: occasionally   . Drug use: No     Allergies   Patient has no known allergies.   Review of Systems Review of Systems Pertinent negatives listed in HPI  Physical Exam Triage Vital Signs ED Triage Vitals  Enc Vitals Group     BP 04/29/20 1347 134/81     Pulse Rate 04/29/20 1347 85     Resp 04/29/20 1347 18     Temp 04/29/20 1347 98.3 F (36.8 C)     Temp Source 04/29/20 1347 Oral     SpO2 04/29/20 1347 96 %     Weight --  Height --      Head Circumference --      Peak Flow --      Pain Score 04/29/20 1345 0     Pain Loc --      Pain Edu? --      Excl. in GC? --    No data found.  Updated Vital Signs BP 134/81 (BP Location: Right Arm)   Pulse 85   Temp 98.3 F (36.8 C) (Oral)   Resp 18   SpO2 96%   Visual Acuity Right Eye Distance:   Left Eye Distance:   Bilateral Distance:    Right Eye Near:   Left Eye Near:    Bilateral Near:     Physical Exam General appearance: alert, well developed, cooperative  Head: Normocephalic, without obvious abnormality, atraumatic Respiratory: Respirations even and unlabored, normal respiratory rate Heart: Rate and rhythm normal. No gallop or murmurs noted on exam  Extremities: left knee laceration , full ROM, no bruising, effusion, or ecchymosis   Psych: Appropriate mood and affect.   UC Treatments / Results  Labs (all labs ordered are listed, but  only abnormal results are displayed) Labs Reviewed - No data to display  EKG   Radiology No results found.  Procedures Laceration Repair  Date/Time: 04/29/2020 7:08 PM Performed by: Bing Neighbors, FNP Authorized by: Bing Neighbors, FNP   Consent:    Consent obtained:  Verbal   Consent given by:  Patient   Risks discussed:  Infection and pain   Alternatives discussed:  No treatment Universal protocol:    Patient identity confirmed:  Verbally with patient Anesthesia:    Anesthesia method:  Local infiltration   Local anesthetic:  Lidocaine 2% WITH epi Laceration details:    Location: Knee, left    Length (cm):  5.2   Depth (mm):  2 Exploration:    Contaminated: no   Treatment:    Area cleansed with:  Saline and povidone-iodine   Amount of cleaning:  Standard   Layers/structures repaired:  Deep subcutaneous Skin repair:    Repair method:  Sutures   Suture size:  3-0   Suture material:  Prolene   Suture technique:  Simple interrupted Approximation:    Approximation:  Loose Repair type:    Repair type:  Simple Post-procedure details:    Dressing:  Non-adherent dressing   Procedure completion:  Tolerated well, no immediate complications   (including critical care time)  Medications Ordered in UC Medications  Tdap (BOOSTRIX) injection 0.5 mL (0.5 mLs Intramuscular Given 04/29/20 1507)    Initial Impression / Assessment and Plan / UC Course  I have reviewed the triage vital signs and the nursing notes.  Pertinent labs & imaging results that were available during my care of the patient were reviewed by me and considered in my medical decision making (see chart for details).    Patient presents today with acute knee laceration.  Repaired with 4 sutures without complication.  Hemostasis achieved.  Wound care instructions discussed.  Patient will return in 10 days for removal of sutures.   Final Clinical Impressions(s) / UC Diagnoses   Final diagnoses:   Knee laceration, right, initial encounter  Need for tetanus booster     Discharge Instructions     Return in 10 days to have sutures removed.  Start Keflex twice daily for the next 7 days for prophylaxis against any skin infection.  You may wash area with regular warm soap and water.  Change dressing  twice daily.   ED Prescriptions    Medication Sig Dispense Auth. Provider   cephALEXin (KEFLEX) 750 MG capsule Take 1 capsule (750 mg total) by mouth in the morning and at bedtime for 7 days. 14 capsule Bing Neighbors, FNP     PDMP not reviewed this encounter.   Bing Neighbors, FNP 04/29/20 1910

## 2020-05-09 ENCOUNTER — Other Ambulatory Visit: Payer: Self-pay

## 2020-05-09 ENCOUNTER — Ambulatory Visit
Admission: EM | Admit: 2020-05-09 | Discharge: 2020-05-09 | Disposition: A | Discharge status: Home / Self Care | Payer: 59

## 2020-05-09 NOTE — ED Triage Notes (Signed)
Here for suture removal

## 2020-05-09 NOTE — ED Notes (Signed)
3 sutures removed.  Unable to find the 4th.  Langston Masker PA also unable to find the 4th suture,.

## 2022-02-11 ENCOUNTER — Ambulatory Visit
Admission: RE | Admit: 2022-02-11 | Discharge: 2022-02-11 | Disposition: A | Discharge status: Home / Self Care | Payer: Medicare Other | Source: Ambulatory Visit

## 2022-02-11 ENCOUNTER — Ambulatory Visit (INDEPENDENT_AMBULATORY_CARE_PROVIDER_SITE_OTHER): Payer: Medicare Other

## 2022-02-11 VITALS — BP 140/74 | HR 69 | Temp 98.4°F | Resp 18

## 2022-02-11 DIAGNOSIS — M25512 Pain in left shoulder: Secondary | ICD-10-CM | POA: Diagnosis not present

## 2022-02-11 MED ORDER — DICLOFENAC SODIUM 1 % EX GEL: 4.0000 g | Freq: Four times a day (QID) | CUTANEOUS | 0 refills | Status: AC

## 2022-02-11 NOTE — ED Provider Notes (Signed)
[facility]    CSN: [id number] Arrival date & time: 02/11/22  1046      History   Chief Complaint Chief Complaint  Patient presents with   Shoulder Pain    Entered by patient    HPI Catherine Ali is a 66 y.o. female.   The history is provided by the patient.   The patient presents for complaints of shoulder pain that have been present for the past several days after she was picking up wood to put on the splitter.  She states that as she was picking up the wood, she did not notice anything abnormal in her left shoulder.  She states it was afterwards that she noticed pain in the left shoulder.  Pain is located in the lateral aspect of the left shoulder joint.  She states that she has pain when she tries to lift her arm above shoulder height.  She also has pain when she tries to move her left shoulder/arm across her chest.  She states that she does not have any pain when she extends her left arm behind her.  She states that she does not have any numbness, tingling, or radiation of pain, and also does not have any neck pain.  She states she has not had any previous shoulder injury.  She has not taken any medication for her symptoms.  Past Medical History:  Diagnosis Date   Anemia    Anxiety    Calculus of kidney    Chronic kidney disease    Diabetes mellitus without complication (HCC)    Hyponatremia    Perinephric abscess    Skull fracture (HCC)    hx of at age 88   Xanthogranulomatous pyelonephritis     Patient Active Problem List   Diagnosis Date Noted   XGP (xanthogranulomatous pyelonephritis) 09/11/2014   Xanthogranulomatous pyelonephritis    Type 2 diabetes mellitus ([place]) 08/21/2014   Renal abscess, left 08/21/2014   Hyponatremia 08/21/2014    Past Surgical History:  Procedure Laterality Date   LAPAROSCOPIC NEPHRECTOMY Left 09/11/2014   Procedure: LEFT LAPAROSCOPIC NEPHRECTOMY;  Surgeon: Ardis Hughs, MD;  Location: [facility];  Service: Urology;   Laterality: Left;   perinephric drain      times 2   TUBAL LIGATION     clipped at age 65    OB History   No obstetric history on file.      Home Medications    Prior to Admission medications   Medication Sig Start Date End Date Taking? Authorizing Provider  diclofenac Sodium (VOLTAREN ARTHRITIS PAIN) 1 % GEL Apply 4 g topically 4 (four) times daily. 02/11/22  Yes Kailyn Dubie-Warren, Alda Lea, NP  LANTUS SOLOSTAR 100 UNIT/ML Solostar Pen Inject into the skin. 02/09/22  Yes [provider]  Cinnamon 500 MG capsule Take 1,000 mg by mouth daily.    [provider]  Insulin Glargine (TOUJEO SOLOSTAR) 300 UNIT/ML SOPN Inject 20 Units into the skin daily.    [provider]  metFORMIN (GLUCOPHAGE) 500 MG tablet Take 500 mg by mouth 2 (two) times daily with a meal.    [provider]  Multiple Vitamin (MULTIVITAMIN) tablet Take 1 tablet by mouth daily.    [provider]    Family History History reviewed. No pertinent family history.  Social History Social History   Tobacco Use   Smoking status: Never   Smokeless tobacco: Never  Substance Use Topics   Alcohol use: Yes    Comment:  occasionally    Drug use: No     Allergies   Patient has no known allergies.   Review of Systems Review of Systems Per HPI  Physical Exam Triage Vital Signs ED Triage Vitals  Enc Vitals Group     BP 02/11/22 1113 (!) 140/74     Pulse Rate 02/11/22 1113 69     Resp 02/11/22 1113 18     Temp 02/11/22 1113 98.4 F (36.9 C)     Temp Source 02/11/22 1113 Oral     SpO2 02/11/22 1113 92 %     Weight --      Height --      Head Circumference --      Peak Flow --      Pain Score 02/11/22 1117 9     Pain Loc --      Pain Edu? --      Excl. in GC? --    No data found.  Updated Vital Signs BP (!) 140/74 (BP Location: Right Arm)   Pulse 69   Temp 98.4 F (36.9 C) (Oral)   Resp 18   SpO2 92%   Visual Acuity Right Eye Distance:   Left Eye  Distance:   Bilateral Distance:    Right Eye Near:   Left Eye Near:    Bilateral Near:     Physical Exam Vitals and nursing note reviewed.  Constitutional:      General: She is not in acute distress.    Appearance: Normal appearance.  HENT:     Head: Normocephalic.  Musculoskeletal:     Left shoulder: Tenderness present. No swelling or deformity. Decreased range of motion. Decreased strength. Normal pulse.     Cervical back: Normal range of motion.  Skin:    General: Skin is warm.  Neurological:     General: No focal deficit present.     Mental Status: She is alert and oriented to person, place, and time.  Psychiatric:        Mood and Affect: Mood normal.        Behavior: Behavior normal.      UC Treatments / Results  Labs (all labs ordered are listed, but only abnormal results are displayed) Labs Reviewed - No data to display  EKG   Radiology DG Shoulder Left  Result Date: 02/11/2022 CLINICAL DATA:  Left shoulder pain. Limited range of motion. Symptoms after lifting wood for 4 days. EXAM: LEFT SHOULDER - 2+ VIEW COMPARISON:  None Available. FINDINGS: There is no evidence of fracture or dislocation. Mild acromioclavicular degenerative spurring. Glenohumeral joint space is preserved. Amorphous soft tissue calcifications lateral to the humeral head spanning at least 12 mm. Mild subcortical cystic change in the lateral humeral head. No erosions or evidence of focal bone lesions. IMPRESSION: 1. Soft tissue calcifications lateral to the humeral head, may represent calcific tendinopathy or bursitis. 2. Mild acromioclavicular degenerative spurring. Mild subcortical cystic change in the lateral humeral head suggesting rotator cuff arthropathy. 3. No acute fracture or dislocation. Electronically Signed   By: Narda Rutherford M.D.   On: 02/11/2022 12:03    Procedures Procedures (including critical care time)  Medications Ordered in UC Medications - No data to display  Initial  Impression / Assessment and Plan / UC Course  I have reviewed the triage vital signs and the nursing notes.  Pertinent labs & imaging results that were available during my care of the patient were reviewed by me and considered in my medical decision  making (see chart for details).  Patient presents for complaints of left shoulder pain.  On exam, patient has moderate decreased range of motion and tenderness noted to the lateral aspect of the left shoulder.  There is no obvious deformity, swelling, or ecchymosis present.  X-rays are negative for fracture or dislocation, but suggest involvement of the rotator cuff possibly.  Cannot rule out rotator cuff injury.  Discussed with patient at that I recommend that she follow-up with orthopedics for further evaluation.  Will treat patient shoulder pain at this time.  Patient was given supportive care recommendations to include the use of ice or heat, and use of over-the-counter analgesics for pain.  Patient was also prescribed Voltaren gel to help with her shoulder discomfort.  Patient was also given range of motion exercises.  Patient was given information for Ortho care of  and for [facility].  Patient verbalizes understanding.  All questions were answered.  Patient is stable for discharge. Final Clinical Impressions(s) / UC Diagnoses   Final diagnoses:  Left shoulder pain, unspecified chronicity     Discharge Instructions      The x-rays are negative for fracture or dislocation.  X-rays show that you have possible bursitis and spurring in the shoulder joint.  There is also suggestions of abnormality with your rotator cuff. As discussed, it is recommended that you follow-up with your primary care physician or with orthopedics for further evaluation. Gentle range of motion and stretching exercises while symptoms persist. May take over-the-counter Tylenol arthritis strength as needed for pain or discomfort. Apply ice for pain or swelling, heat  for spasm or stiffness.  Apply for 20 minutes, remove for 1 hour, then repeat is much as possible. May follow-up with Ortho care of  at [phone] or with [facility] at [phone]. Follow-up as needed.     ED Prescriptions     Medication Sig Dispense Auth. Provider   diclofenac Sodium (VOLTAREN ARTHRITIS PAIN) 1 % GEL Apply 4 g topically 4 (four) times daily. 150 g Adonai Helzer-Warren, Alda Lea, NP      PDMP not reviewed this encounter.   Tish Men, NP 02/11/22 1227

## 2022-02-11 NOTE — ED Triage Notes (Addendum)
Pt reports she was lifting wood and her left shoulder gave out on Saturday. Did not take anything for it. Limited ROM. Cannot lift left shoulder at all

## 2022-02-11 NOTE — Discharge Instructions (Addendum)
The x-rays are negative for fracture or dislocation.  X-rays show that you have possible bursitis and spurring in the shoulder joint.  There is also suggestions of abnormality with your rotator cuff. As discussed, it is recommended that you follow-up with your primary care physician or with orthopedics for further evaluation. Gentle range of motion and stretching exercises while symptoms persist. May take over-the-counter Tylenol arthritis strength as needed for pain or discomfort. Apply ice for pain or swelling, heat for spasm or stiffness.  Apply for 20 minutes, remove for 1 hour, then repeat is much as possible. May follow-up with [facility] care of [place] at [phone] or with [facility] at [phone]. Follow-up as needed.

## 2023-06-10 ENCOUNTER — Emergency Department (HOSPITAL_COMMUNITY)

## 2023-06-10 ENCOUNTER — Emergency Department (HOSPITAL_COMMUNITY): Admission: EM | Admit: 2023-06-10 | Discharge: 2023-06-10 | Disposition: A | Discharge status: Home / Self Care

## 2023-06-10 ENCOUNTER — Other Ambulatory Visit: Payer: Self-pay

## 2023-06-10 ENCOUNTER — Encounter (HOSPITAL_COMMUNITY): Payer: Self-pay

## 2023-06-10 DIAGNOSIS — N390 Urinary tract infection, site not specified: Secondary | ICD-10-CM

## 2023-06-10 DIAGNOSIS — R42 Dizziness and giddiness: Secondary | ICD-10-CM | POA: Diagnosis present

## 2023-06-10 DIAGNOSIS — Z794 Long term (current) use of insulin: Secondary | ICD-10-CM | POA: Diagnosis not present

## 2023-06-10 DIAGNOSIS — R110 Nausea: Secondary | ICD-10-CM | POA: Insufficient documentation

## 2023-06-10 DIAGNOSIS — Z7984 Long term (current) use of oral hypoglycemic drugs: Secondary | ICD-10-CM | POA: Diagnosis not present

## 2023-06-10 DIAGNOSIS — E119 Type 2 diabetes mellitus without complications: Secondary | ICD-10-CM | POA: Diagnosis not present

## 2023-06-10 LAB — BASIC METABOLIC PANEL WITH GFR
Anion gap: 10 (ref 5–15)
BUN: 23 mg/dL (ref 8–23)
CO2: 22 mmol/L (ref 22–32)
Calcium: 9.1 mg/dL (ref 8.9–10.3)
Chloride: 105 mmol/L (ref 98–111)
Creatinine, Ser: 0.89 mg/dL (ref 0.44–1.00)
GFR, Estimated: >60 mL/min (ref >60)
Glucose, Bld: 169 mg/dL — ABNORMAL HIGH (ref 70–99)
Potassium: 4.7 mmol/L (ref 3.5–5.1)
Sodium: 137 mmol/L (ref 135–145)

## 2023-06-10 LAB — URINALYSIS, ROUTINE W REFLEX MICROSCOPIC
Bacteria, UA: NONE SEEN
Bilirubin Urine: NEGATIVE
Glucose, UA: NEGATIVE mg/dL
Ketones, ur: NEGATIVE mg/dL
Nitrite: NEGATIVE
Protein, ur: NEGATIVE mg/dL
Specific Gravity, Urine: 1.006 (ref 1.005–1.030)
WBC, UA: >50 WBC/hpf (ref 0–5)
pH: 7 (ref 5.0–8.0)

## 2023-06-10 LAB — CBC
HCT: 42.2 % (ref 36.0–46.0)
Hemoglobin: 13.8 g/dL (ref 12.0–15.0)
MCH: 28.3 pg (ref 26.0–34.0)
MCHC: 32.7 g/dL (ref 30.0–36.0)
MCV: 86.7 fL (ref 80.0–100.0)
Platelets: 265 10*3/uL (ref 150–400)
RBC: 4.87 MIL/uL (ref 3.87–5.11)
RDW: 13.2 % (ref 11.5–15.5)
WBC: 7 10*3/uL (ref 4.0–10.5)
nRBC: 0 % (ref 0.0–0.2)

## 2023-06-10 LAB — TROPONIN I (HIGH SENSITIVITY)
Troponin I (High Sensitivity): 5 ng/L (ref <18)
Troponin I (High Sensitivity): 5 ng/L (ref <18)

## 2023-06-10 MED ORDER — MECLIZINE HCL 12.5 MG PO TABS
25.0000 mg | ORAL_TABLET | Freq: Once | ORAL | Status: AC
  Administered 2023-06-10: 25 mg via ORAL
  Filled 2023-06-10: qty 2

## 2023-06-10 MED ORDER — MECLIZINE HCL 25 MG PO TABS
25.0000 mg | ORAL_TABLET | Freq: Three times a day (TID) | ORAL | 0 refills | Status: AC | PRN
Start: 2023-06-10 — End: ?

## 2023-06-10 MED ORDER — LACTATED RINGERS IV BOLUS
1000.0000 mL | Freq: Once | INTRAVENOUS | Status: AC
  Administered 2023-06-10: 1000 mL via INTRAVENOUS

## 2023-06-10 MED ORDER — CEPHALEXIN 500 MG PO CAPS
500.0000 mg | ORAL_CAPSULE | Freq: Once | ORAL | Status: AC
  Administered 2023-06-10: 500 mg via ORAL
  Filled 2023-06-10: qty 1

## 2023-06-10 MED ORDER — CEPHALEXIN 500 MG PO CAPS: 500.0000 mg | ORAL_CAPSULE | Freq: Four times a day (QID) | ORAL | 0 refills | Status: AC

## 2023-06-10 NOTE — ED Notes (Signed)
 Pt is back from MRI.

## 2023-06-10 NOTE — ED Notes (Signed)
 Pt transported to MRI

## 2023-06-10 NOTE — Discharge Instructions (Addendum)
 Your workup today was reassuring.  Your blood counts are stable.  Please follow-up with the gastroenterologist and OB/GYN for reevaluation as we discussed.  Take the meclizine as needed for dizziness.  Your urine looks like there may be an infection.  Please take the antibiotic as prescribed.  Return to the ER for worsening symptoms.

## 2023-06-10 NOTE — ED Triage Notes (Signed)
 Pt bib [facility] for dizziness and nausea. Pt denies V/D and denies pain. Pt states dizziness and nausea started this morning after she took a shower. Pt states she took a shower and laid across the bed and when she went to get up is when it started.

## 2023-06-10 NOTE — ED Provider Notes (Addendum)
 [place] EMERGENCY DEPARTMENT AT [facility] Provider Note   CSN: [id number] Arrival date & time: 06/10/23  4098     History  Chief Complaint  Patient presents with   Dizziness   Nausea    Catherine Ali is a 68 y.o. female.  68 year old female with past medical history of diabetes and anemia presenting to the emergency department today with dizziness and nausea.  The patient states this started around 6 AM.  She states that she got up early this morning and took a shower.  She states that afterwards she lied back down for few minutes and when she got up that she started feeling dizzy with movement.  She states that if she sits totally still that this does seem to help.  She has had some nausea but denies any vomiting.  She denies any headache or chest pain or shortness of breath.  She came to the ER today due to these ongoing symptoms.  Other than movement she denies any exacerbating or alleviating factors.  She denies any weakness, numbness, or tingling.   Dizziness      Home Medications Prior to Admission medications   Medication Sig Start Date End Date Taking? Authorizing Provider  meclizine (ANTIVERT) 25 MG tablet Take 1 tablet (25 mg total) by mouth 3 (three) times daily as needed for dizziness. 06/10/23  Yes Durwin Glaze, MD  Cinnamon 500 MG capsule Take 1,000 mg by mouth daily.    [provider]  diclofenac Sodium (VOLTAREN ARTHRITIS PAIN) 1 % GEL Apply 4 g topically 4 (four) times daily. 02/11/22   Leath-Warren, Sadie Haber, NP  Insulin Glargine (TOUJEO SOLOSTAR) 300 UNIT/ML SOPN Inject 20 Units into the skin daily.    [provider]  LANTUS SOLOSTAR 100 UNIT/ML Solostar Pen Inject into the skin. 02/09/22   [provider]  losartan (COZAAR) 25 MG tablet Take 25 mg by mouth daily. 08/31/22   [provider]  metFORMIN (GLUCOPHAGE) 500 MG tablet Take 500 mg by mouth 2 (two) times daily with a meal.    [provider]  Multiple Vitamin (MULTIVITAMIN) tablet Take 1 tablet by mouth daily.    [provider]      Allergies    Metformin hcl er    Review of Systems   Review of Systems  Neurological:  Positive for dizziness.  All other systems reviewed and are negative.   Physical Exam Updated Vital Signs BP (!) 161/64   Pulse 72   Temp 97.9 F (36.6 C) (Oral)   Resp 17   Ht 5\' 6"  (1.676 m)   Wt 68 kg   SpO2 96%   BMI 24.20 kg/m  Physical Exam Vitals and nursing note reviewed.   Gen: NAD Eyes: PERRL, EOMI, no vertical nystagmus, no obvoius unilateral horizontal nystagmus HEENT: no oropharyngeal swelling Neck: trachea midline Resp: clear to auscultation bilaterally Card: RRR, no murmurs, rubs, or gallops Abd: nontender, nondistended Extremities: no calf tenderness, no edema Vascular: 2+ radial pulses bilaterally, 2+ DP pulses bilaterally Neuro: Cns intact, equal strength sensation throughout bilateral upper and lower extremities with no dysmetria on finger-to-nose testing Skin: no rashes Psyc: acting appropriately   ED Results / Procedures / Treatments   Labs (all labs ordered are listed, but only abnormal results are displayed) Labs Reviewed  BASIC METABOLIC PANEL WITH GFR - Abnormal; Notable for the following components:      Result Value   Glucose, Bld 169 (*)  All other components within normal limits  CBC  URINALYSIS, ROUTINE W REFLEX MICROSCOPIC  TROPONIN I (HIGH SENSITIVITY)  TROPONIN I (HIGH SENSITIVITY)    EKG EKG Interpretation Date/Time:  Thursday June 10 2023 09:02:35 EDT Ventricular Rate:  70 PR Interval:  150 QRS Duration:  106 QT Interval:  398 QTC Calculation: 430 R Axis:   8  Text Interpretation: Sinus rhythm Confirmed by Beckey Downing [phone]) on 06/10/2023 9:06:51 AM  Radiology MR BRAIN WO CONTRAST Result Date: 06/10/2023 CLINICAL DATA:  Neuro deficit, acute, stroke suspected. Dizziness and nausea. EXAM: MRI HEAD WITHOUT  CONTRAST TECHNIQUE: Multiplanar, multiecho pulse sequences of the brain and surrounding structures were obtained without intravenous contrast. COMPARISON:  None Available. FINDINGS: Brain: There is no evidence of an acute infarct, intracranial hemorrhage, mass, midline shift, or extra-axial fluid collection. Cerebral volume is normal. The ventricles are normal in size. No significant white matter disease is seen for age. Vascular: Major intracranial arterial flow voids are preserved. Partially abnormal flow void in the nondominant right transverse sinus on some sequences is favored to reflect slow flow rather than occlusion given visible flow void throughout on the coronal T2 sequence. Skull and upper cervical spine: Unremarkable bone marrow signal. Sinuses/Orbits: Unremarkable orbits. Paranasal sinuses and mastoid air cells are clear. Other: None. IMPRESSION: Unremarkable appearance of the brain for age. Electronically Signed   By: Sebastian Ache M.D.   On: 06/10/2023 12:55    Procedures Procedures    Medications Ordered in ED Medications  meclizine (ANTIVERT) tablet 25 mg (25 mg Oral Given 06/10/23 0915)  lactated ringers bolus 1,000 mL (1,000 mLs Intravenous Bolus 06/10/23 0914)    ED Course/ Medical Decision Making/ A&P                                 Medical Decision Making 68 year old female with past medical history of diabetes presenting to the emergency department today with dizziness and nausea.  I will further evaluate patient here with basic labs well as an EKG and troponin to evaluate for atypical ACS or electrolyte abnormalities as well as anemia.  I will give the patient meclizine here for possible peripheral vertigo.  She does not have any obvious neurodeficits here on exam and her symptoms resolved at rest I suspect this very well may be due to peripheral vertigo.  She appears that she may have some mild right going nystagmus that is not consistent which could go along with this.  Will  give patient IV fluids.  If the patient is not feeling better with the above medications and treatments will obtain an MRI for further evaluation for central etiology.  The patient was still having some dizziness after the initial dose of meclizine.  MRI is ordered.  This is negative.  The patient is feeling better on reassessment.  She is discharged with return precautions.  The patient did tell me after my initial evaluation that she has been having some intermittent bleeding that she is noted in the toilet but she is not bleeding currently.  This is been going on now for the better part of 1 year.  She has been given referrals to gastroenterology as she has not had a colonoscopy in years and is overdue as well as for OB/GYN as well.  She is discharged with return precautions.  Amount and/or Complexity of Data Reviewed Labs: ordered. Radiology: ordered.  Final Clinical Impression(s) / ED Diagnoses Final diagnoses:  Dizziness    Rx / DC Orders ED Discharge Orders          Ordered    meclizine (ANTIVERT) 25 MG tablet  3 times daily PRN        06/10/23 1520              Durwin Glaze, MD 06/10/23 1522    Durwin Glaze, MD 06/10/23 [phone]

## 2023-09-13 ENCOUNTER — Ambulatory Visit
Admission: EM | Admit: 2023-09-13 | Discharge: 2023-09-13 | Disposition: A | Discharge status: Home / Self Care | Attending: Nurse Practitioner | Admitting: Nurse Practitioner

## 2023-09-13 DIAGNOSIS — J069 Acute upper respiratory infection, unspecified: Secondary | ICD-10-CM | POA: Diagnosis not present

## 2023-09-13 HISTORY — DX: Endometriosis, unspecified: N80.9

## 2023-09-13 LAB — POC SARS CORONAVIRUS 2 AG -  ED: SARS Coronavirus 2 Ag: NEGATIVE

## 2023-09-13 MED ORDER — BENZONATATE 100 MG PO CAPS: 100.0000 mg | ORAL_CAPSULE | Freq: Three times a day (TID) | ORAL | 0 refills | Status: AC | PRN

## 2023-09-13 NOTE — Discharge Instructions (Signed)
 You have a viral upper respiratory infection.  Symptoms should improve over the next week to 10 days.  If you develop chest pain or shortness of breath, go to the emergency room.  COVID-19 test is negative today.   Some things that can make you feel better are: - Increased rest - Increasing fluid with water/sugar free electrolytes - Acetaminophen  and ibuprofen as needed for fever/pain - Salt water gargling, chloraseptic spray and throat lozenges - OTC guaifenesin (Mucinex) 600 mg twice daily - Saline sinus flushes or a neti pot - Humidifying the air -Tessalon Perles every 8 hours as needed for dry cough

## 2023-09-13 NOTE — ED Provider Notes (Signed)
 [facility]    CSN: [id number] Arrival date & time: 09/13/23  1327      History   Chief Complaint Chief Complaint  Patient presents with   Cough    HPI Catherine Ali is a 68 y.o. female.   Patient presents today with 4 day history of dry cough, runny and stuffy nose, post nasal drainage and sore throat, headache, right ear pain without change in hearing or drainage, and fatigue.  No fever, body aches/chills, chest pain or shortness of breath, congested cough, new abdominal pain/upset, nausea/vomiting or diarrhea.  Has taken Mucinex and other OTC meds with minimal temporary improvement.    Past Medical History:  Diagnosis Date   Anemia    Anxiety    Calculus of kidney    Chronic kidney disease    Diabetes mellitus without complication (HCC)    Endometriosis    Hyponatremia    Perinephric abscess    Skull fracture (HCC)    hx of at age 43   Xanthogranulomatous pyelonephritis     Patient Active Problem List   Diagnosis Date Noted   XGP (xanthogranulomatous pyelonephritis) 09/11/2014   Xanthogranulomatous pyelonephritis    Type 2 diabetes mellitus (HCC) 08/21/2014   Renal abscess, left 08/21/2014   Hyponatremia 08/21/2014    Past Surgical History:  Procedure Laterality Date   LAPAROSCOPIC NEPHRECTOMY Left 09/11/2014   Procedure: LEFT LAPAROSCOPIC NEPHRECTOMY;  Surgeon: Morene LELON Salines, MD;  Location: [facility];  Service: Urology;  Laterality: Left;   perinephric drain      times 2   TUBAL LIGATION     clipped at age 2    OB History   No obstetric history on file.      Home Medications    Prior to Admission medications   Medication Sig Start Date End Date Taking? Authorizing Provider  benzonatate (TESSALON) 100 MG capsule Take 1-2 capsules (100-200 mg total) by mouth 3 (three) times daily as needed for cough. Do not take with alcohol or while operating or driving heavy machinery 3/69/74  Yes Chandra Raisin A, NP  cephALEXin   (KEFLEX ) 500 MG capsule Take 1 capsule (500 mg total) by mouth 4 (four) times daily. 06/10/23   Ula Prentice SAUNDERS, MD  Cinnamon 500 MG capsule Take 1,000 mg by mouth daily.    [provider]  diclofenac  Sodium (VOLTAREN  ARTHRITIS PAIN) 1 % GEL Apply 4 g topically 4 (four) times daily. 02/11/22   Leath-Warren, Etta PARAS, NP  Insulin  Glargine (TOUJEO SOLOSTAR) 300 UNIT/ML SOPN Inject 20 Units into the skin daily.    [provider]  LANTUS SOLOSTAR 100 UNIT/ML Solostar Pen Inject into the skin. 02/09/22   [provider]  losartan (COZAAR) 25 MG tablet Take 25 mg by mouth daily. 08/31/22   [provider]  meclizine  (ANTIVERT ) 25 MG tablet Take 1 tablet (25 mg total) by mouth 3 (three) times daily as needed for dizziness. 06/10/23   Ula Prentice SAUNDERS, MD  metFORMIN (GLUCOPHAGE) 500 MG tablet Take 500 mg by mouth 2 (two) times daily with a meal.    [provider]  Multiple Vitamin (MULTIVITAMIN) tablet Take 1 tablet by mouth daily.    [provider]    Family History History reviewed. No pertinent family history.  Social History Social History   Tobacco Use   Smoking status: Never   Smokeless tobacco: Never  Substance Use Topics   Alcohol use: Yes    Comment: occasionally    Drug  use: No     Allergies   Metformin hcl er   Review of Systems Review of Systems Per HPI  Physical Exam Triage Vital Signs ED Triage Vitals  Encounter Vitals Group     BP 09/13/23 1346 132/77     Girls Systolic BP Percentile --      Girls Diastolic BP Percentile --      Boys Systolic BP Percentile --      Boys Diastolic BP Percentile --      Pulse Rate 09/13/23 1346 (!) 105     Resp 09/13/23 1346 18     Temp 09/13/23 1346 98.3 F (36.8 C)     Temp Source 09/13/23 1346 Oral     SpO2 09/13/23 1346 95 %     Weight --      Height --      Head Circumference --      Peak Flow --      Pain Score 09/13/23 1348 3     Pain Loc --      Pain Education --       Exclude from Growth Chart --    No data found.  Updated Vital Signs BP 132/77 (BP Location: Right Arm)   Pulse (!) 105   Temp 98.3 F (36.8 C) (Oral)   Resp 18   SpO2 95%   Visual Acuity Right Eye Distance:   Left Eye Distance:   Bilateral Distance:    Right Eye Near:   Left Eye Near:    Bilateral Near:     Physical Exam Vitals and nursing note reviewed.  Constitutional:      General: She is not in acute distress.    Appearance: Normal appearance. She is not ill-appearing or toxic-appearing.  HENT:     Head: Normocephalic and atraumatic.     Right Ear: Tympanic membrane, ear canal and external ear normal.     Left Ear: Tympanic membrane, ear canal and external ear normal.     Nose: No congestion or rhinorrhea.     Mouth/Throat:     Mouth: Mucous membranes are moist.     Pharynx: Oropharynx is clear. No oropharyngeal exudate or posterior oropharyngeal erythema.   Eyes:     General: No scleral icterus.    Extraocular Movements: Extraocular movements intact.    Cardiovascular:     Rate and Rhythm: Regular rhythm. Tachycardia present.  Pulmonary:     Effort: Pulmonary effort is normal. No respiratory distress.     Breath sounds: Normal breath sounds. No wheezing, rhonchi or rales.   Musculoskeletal:     Cervical back: Normal range of motion and neck supple.  Lymphadenopathy:     Cervical: No cervical adenopathy.   Skin:    General: Skin is warm and dry.     Coloration: Skin is not jaundiced or pale.     Findings: No erythema or rash.   Neurological:     Mental Status: She is alert and oriented to person, place, and time.   Psychiatric:        Behavior: Behavior is cooperative.      UC Treatments / Results  Labs (all labs ordered are listed, but only abnormal results are displayed) Labs Reviewed  POC SARS CORONAVIRUS 2 AG -  ED    EKG   Radiology No results found.  Procedures Procedures (including critical care time)  Medications  Ordered in UC Medications - No data to display  Initial Impression / Assessment and Plan /  UC Course  I have reviewed the triage vital signs and the nursing notes.  Pertinent labs & imaging results that were available during my care of the patient were reviewed by me and considered in my medical decision making (see chart for details).   Patient is well-appearing, normotensive, afebrile, is mildly tachycardic, not tachypneic, oxygenating well on room air.   1. Viral URI with cough Vitals and exam are reassuring in triage today COVID-19 test is negative today Supportive care discussed, start cough suppressant medication ER and return precautions discussed   The patient was given the opportunity to ask questions.  All questions answered to their satisfaction.  The patient is in agreement to this plan.   Final Clinical Impressions(s) / UC Diagnoses   Final diagnoses:  Viral URI with cough     Discharge Instructions      You have a viral upper respiratory infection.  Symptoms should improve over the next week to 10 days.  If you develop chest pain or shortness of breath, go to the emergency room.  COVID-19 test is negative today.   Some things that can make you feel better are: - Increased rest - Increasing fluid with water/sugar free electrolytes - Acetaminophen  and ibuprofen as needed for fever/pain - Salt water gargling, chloraseptic spray and throat lozenges - OTC guaifenesin (Mucinex) 600 mg twice daily - Saline sinus flushes or a neti pot - Humidifying the air -Tessalon Perles every 8 hours as needed for dry cough      ED Prescriptions     Medication Sig Dispense Auth. Provider   benzonatate (TESSALON) 100 MG capsule Take 1-2 capsules (100-200 mg total) by mouth 3 (three) times daily as needed for cough. Do not take with alcohol or while operating or driving heavy machinery 30 capsule Chandra Harlene LABOR, NP      PDMP not reviewed this encounter.   Chandra Harlene LABOR, NP 09/13/23 [phone]

## 2023-09-13 NOTE — ED Triage Notes (Signed)
 Pt reports she has a cough, nasal congestion, throat pain, right ear tenderness x 4 days   Took otc cold and flu meds

## 2023-10-12 ENCOUNTER — Encounter (INDEPENDENT_AMBULATORY_CARE_PROVIDER_SITE_OTHER): Payer: Self-pay | Admitting: *Deleted
